# Patient Record
Sex: Female | Born: 1945
Health system: Southern US, Community
[De-identification: ages and names within clinical notes are randomized; demographics above are authoritative.]

## PROBLEM LIST (undated history)

## (undated) DIAGNOSIS — I779 Disorder of arteries and arterioles, unspecified: Secondary | ICD-10-CM

## (undated) DIAGNOSIS — K219 Gastro-esophageal reflux disease without esophagitis: Secondary | ICD-10-CM

## (undated) DIAGNOSIS — M1990 Unspecified osteoarthritis, unspecified site: Secondary | ICD-10-CM

## (undated) DIAGNOSIS — I739 Peripheral vascular disease, unspecified: Secondary | ICD-10-CM

## (undated) DIAGNOSIS — I10 Essential (primary) hypertension: Secondary | ICD-10-CM

## (undated) DIAGNOSIS — E785 Hyperlipidemia, unspecified: Secondary | ICD-10-CM

## (undated) DIAGNOSIS — E039 Hypothyroidism, unspecified: Secondary | ICD-10-CM

## (undated) DIAGNOSIS — R0600 Dyspnea, unspecified: Secondary | ICD-10-CM

## (undated) HISTORY — DX: Dyspnea, unspecified: R06.00

## (undated) HISTORY — DX: Hypothyroidism, unspecified: E03.9

## (undated) HISTORY — DX: Peripheral vascular disease, unspecified: I73.9

## (undated) HISTORY — DX: Essential (primary) hypertension: I10

## (undated) HISTORY — DX: Gastro-esophageal reflux disease without esophagitis: K21.9

## (undated) HISTORY — DX: Disorder of arteries and arterioles, unspecified: I77.9

## (undated) HISTORY — DX: Unspecified osteoarthritis, unspecified site: M19.90

## (undated) HISTORY — DX: Hyperlipidemia, unspecified: E78.5

---

## 2001-12-26 HISTORY — PX: LAPAROSCOPIC CHOLECYSTECTOMY: SUR755

## 2005-05-15 ENCOUNTER — Emergency Department (HOSPITAL_COMMUNITY): Admission: EM | Admit: 2005-05-15 | Discharge: 2005-05-15 | Payer: Self-pay | Admitting: Family Medicine

## 2008-01-16 ENCOUNTER — Ambulatory Visit: Payer: Self-pay | Admitting: Cardiovascular Disease

## 2008-01-22 ENCOUNTER — Ambulatory Visit: Payer: Self-pay

## 2008-06-17 ENCOUNTER — Ambulatory Visit: Payer: Self-pay | Admitting: Cardiovascular Disease

## 2008-06-17 LAB — CONVERTED CEMR LAB
AST: 51 units/L — ABNORMAL HIGH (ref 0–37)
Albumin: 3.8 g/dL (ref 3.5–5.2)
Alkaline Phosphatase: 68 units/L (ref 39–117)
Total Bilirubin: 0.9 mg/dL (ref 0.3–1.2)

## 2008-09-15 ENCOUNTER — Ambulatory Visit: Payer: Self-pay

## 2009-03-31 ENCOUNTER — Ambulatory Visit: Payer: Self-pay

## 2009-08-31 ENCOUNTER — Encounter: Payer: Self-pay | Admitting: Cardiovascular Disease

## 2009-11-30 ENCOUNTER — Telehealth (INDEPENDENT_AMBULATORY_CARE_PROVIDER_SITE_OTHER): Payer: Self-pay | Admitting: *Deleted

## 2009-12-28 ENCOUNTER — Telehealth: Payer: Self-pay | Admitting: Cardiovascular Disease

## 2010-04-03 ENCOUNTER — Encounter: Payer: Self-pay | Admitting: Cardiovascular Disease

## 2010-04-12 DIAGNOSIS — R0989 Other specified symptoms and signs involving the circulatory and respiratory systems: Secondary | ICD-10-CM

## 2010-04-12 DIAGNOSIS — M1990 Unspecified osteoarthritis, unspecified site: Secondary | ICD-10-CM | POA: Insufficient documentation

## 2010-04-12 DIAGNOSIS — E785 Hyperlipidemia, unspecified: Secondary | ICD-10-CM | POA: Insufficient documentation

## 2010-04-12 DIAGNOSIS — E039 Hypothyroidism, unspecified: Secondary | ICD-10-CM | POA: Insufficient documentation

## 2010-04-12 DIAGNOSIS — I10 Essential (primary) hypertension: Secondary | ICD-10-CM | POA: Insufficient documentation

## 2010-04-12 DIAGNOSIS — K219 Gastro-esophageal reflux disease without esophagitis: Secondary | ICD-10-CM

## 2010-04-12 DIAGNOSIS — R0609 Other forms of dyspnea: Secondary | ICD-10-CM

## 2010-04-13 ENCOUNTER — Ambulatory Visit: Payer: Self-pay | Admitting: Cardiovascular Disease

## 2010-04-13 DIAGNOSIS — I6529 Occlusion and stenosis of unspecified carotid artery: Secondary | ICD-10-CM | POA: Insufficient documentation

## 2010-04-27 ENCOUNTER — Encounter: Payer: Self-pay | Admitting: Cardiovascular Disease

## 2010-08-02 ENCOUNTER — Encounter: Payer: Self-pay | Admitting: Cardiovascular Disease

## 2010-09-03 ENCOUNTER — Encounter: Payer: Self-pay | Admitting: Cardiovascular Disease

## 2010-09-06 ENCOUNTER — Ambulatory Visit: Payer: Self-pay

## 2010-09-06 ENCOUNTER — Ambulatory Visit: Payer: Self-pay | Admitting: Cardiovascular Disease

## 2010-10-04 ENCOUNTER — Telehealth: Payer: Self-pay | Admitting: Cardiovascular Disease

## 2010-10-15 ENCOUNTER — Encounter: Payer: Self-pay | Admitting: Cardiovascular Disease

## 2010-10-21 ENCOUNTER — Telehealth: Payer: Self-pay | Admitting: Cardiovascular Disease

## 2011-01-25 NOTE — Assessment & Plan Note (Signed)
Summary: f34m   Visit Type:  Follow-up Primary Derald Lorge:  Eugenie Birks, MD  CC:  none.  History of Present Illness: This is a 65 year old woman presenting today for follow up evaluation. She has a history of asymptomatic, moderate right internal carotid artery stenosis, hypertension, and dyslipidemia. She is a former smoker.   the patient denies chest pain, dyspnea, edema, or palpitations. She has had no neurologic symptoms, and specifically denies numbness, tingling, weakness, aphasia, or amaurosis.  Current Medications (verified): 1)  Crestor 40 Mg Tabs (Rosuvastatin Calcium) .Marland Kitchen.. 1 By Mouth Daily 2)  Prilosec 20 Mg Cpdr (Omeprazole) .... Take One Tablet By Mouth Once Daily. 3)  Celebrex 200 Mg Caps (Celecoxib) .... As Needed 4)  Enalapril Maleate 20 Mg Tabs (Enalapril Maleate) .... Take One Tablet By Mouth Daily 5)  Synthroid 75 Mcg Tabs (Levothyroxine Sodium) .... Take One Tablet By Mouth Once Daily. 6)  Patanol 0.1 % Soln (Olopatadine Hcl) .... 2 Gtt Each Eye 7)  Aspirin 81 Mg Tbec (Aspirin) .... Take One Tablet By Mouth Daily . Not Taking  Allergies: 1)  ! Codeine  Past History:  Past medical history reviewed for relevance to current acute and chronic problems.  Past Medical History: Current Problems:  DYSPNEA ON EXERTION (ICD-786.09) ESSENTIAL HYPERTENSION (ICD-401.9) DYSLIPIDEMIA (ICD-272.4) GASTROESOPHAGEAL REFLUX DISEASE (ICD-530.81) HYPOTHYROIDISM (ICD-244.9) OSTEOARTHRITIS (ICD-715.90) Carotid Stenosis without infarction (433.10)  Vital Signs:  Patient profile:   65 year old female Height:      62 inches Weight:      189.75 pounds BMI:     34.83 Pulse rate:   94 / minute Pulse rhythm:   regular Resp:     18 per minute BP sitting:   140 / 90  (left arm) Cuff size:   large  Vitals Entered By: Vikki Ports (September 06, 2010 1:53 PM)  Physical Exam  General:  Pt is alert and oriented, obese woman, in no acute distress. HEENT: normal Neck: normal  carotid upstrokes with a soft right carotid bruit, JVP normal Lungs: CTA CV: RRR without murmur or gallop Abd: soft, NT, positive BS, no bruit, no organomegaly Ext: no clubbing, cyanosis, or edema. peripheral pulses 2+ and equal Skin: warm and dry without rash    EKG  Procedure date:  09/06/2010  Findings:      NSR 94 bpm, within normal limits.  Impression & Recommendations:  Problem # 1:  CAROTID ARTERY STENOSIS, WITHOUT INFARCTION (ICD-433.10) Preliminary carotid duplex scan reviewed from earlier today and shows 60-79% RICA stenosis with peak velocities 194/49 on the right and 40-59% stenosis of the LICA with peak velocities 113/38. Recommend continued risk reduction measures with ASA, statin, and antihypertensive Rx.  Her updated medication list for this problem includes:    Aspirin 81 Mg Tbec (Aspirin) .Marland Kitchen... Take one tablet by mouth daily . not taking  Orders: EKG w/ Interpretation (93000)  Problem # 2:  ESSENTIAL HYPERTENSION (ICD-401.9) BP above goal. Recommend add HCTZ 12.5 mg daily with followup BMET 2 weeks after initiation of Rx.  Her updated medication list for this problem includes:    Enalapril Maleate 20 Mg Tabs (Enalapril maleate) .Marland Kitchen... Take one tablet by mouth daily    Aspirin 81 Mg Tbec (Aspirin) .Marland Kitchen... Take one tablet by mouth daily . not taking    Hydrochlorothiazide 12.5 Mg Tabs (Hydrochlorothiazide) .Marland Kitchen... Take one tablet by mouth daily.  BP today: 140/90 Prior BP: 138/84 (04/13/2010)  Problem # 3:  DYSLIPIDEMIA (ICD-272.4)  Lipids follow by PCP and goal LDL  is less than 70 mg/dL.  Her updated medication list for this problem includes:    Crestor 40 Mg Tabs (Rosuvastatin calcium) .Marland Kitchen... 1 by mouth daily  Her updated medication list for this problem includes:    Crestor 40 Mg Tabs (Rosuvastatin calcium) .Marland Kitchen... 1 by mouth daily  Patient Instructions: 1)  Your physician recommends that you schedule a follow-up appointment in: 6 months 2)  Your physician  recommends that you have a BMP in 2 weeks. 433.10 3)  Your physician has recommended you make the following change in your medication: START taking HCTZ 12.5mg  by mouth daily. 4)  Your physician has requested that you have a carotid duplex in 6 months. This test is an ultrasound of the carotid arteries in your neck. It looks at blood flow through these arteries that supply the brain with blood. Allow one hour for this exam. There are no restrictions or special instructions. Prescriptions: HYDROCHLOROTHIAZIDE 12.5 MG TABS (HYDROCHLOROTHIAZIDE) Take one tablet by mouth daily.  #30 x 1   Entered by:   Whitney Maeola Sarah RN   Authorized by:   Norva Karvonen, MD   Signed by:   Ellender Hose RN on 09/06/2010   Method used:   Electronically to        [facility]. [address] [phone]* (retail)       [address]       [place], [place]  [address]       Ph: [phone] or [phone]       Fax: [phone]   RxID:   [id number] HYDROCHLOROTHIAZIDE 12.5 MG TABS (HYDROCHLOROTHIAZIDE) Take one tablet by mouth daily.  #90 x 3   Entered by:   Whitney Maeola Sarah RN   Authorized by:   Norva Karvonen, MD   Signed by:   Ellender Hose RN on 09/06/2010   Method used:   Print then Give to Patient   RxID:   [id number]

## 2011-01-25 NOTE — Assessment & Plan Note (Signed)
Summary: rov   Visit Type:  Follow-up Primary Provider:  Eugenie Birks, MD  CC:  none.  History of Present Illness: This is a 65 year old woman presenting today for follow up evaluation. She has a history of asymptomatic, moderate right internal carotid artery stenosis, hypertension, and dyslipidemia. She is a former smoker. Her last carotid duplex scan from April 01, 1999 and showed moderate plaque on the right and mild plaque on the left with 60-79% right ICA stenosis and less than 40% left ICA stenosis.  the patient is doing well at present. She denies chest pain, dyspnea, edema, lightheadedness, or palpitations. She has had no cerebrovascular symptoms.  Current Medications (verified): 1)  Crestor 40 Mg Tabs (Rosuvastatin Calcium) .Marland Kitchen.. 1 By Mouth Daily 2)  Prilosec 20 Mg Cpdr (Omeprazole) .... Take One Tablet By Mouth Once Daily. 3)  Celebrex 200 Mg Caps (Celecoxib) .... As Needed 4)  Enalapril Maleate 20 Mg Tabs (Enalapril Maleate) .... Take One Tablet By Mouth Daily 5)  Synthroid 75 Mcg Tabs (Levothyroxine Sodium) .... Take One Tablet By Mouth Once Daily. 6)  Patanol 0.1 % Soln (Olopatadine Hcl) .... 2 Gtt Each Eye 7)  Aspirin 81 Mg Tbec (Aspirin) .... Take One Tablet By Mouth Daily  Allergies (verified): 1)  ! Codeine  Past History:  Past medical history reviewed for relevance to current acute and chronic problems.  Past Medical History: Reviewed history from 04/12/2010 and no changes required. Current Problems:  DYSPNEA ON EXERTION (ICD-786.09) ESSENTIAL HYPERTENSION (ICD-401.9) DYSLIPIDEMIA (ICD-272.4) GASTROESOPHAGEAL REFLUX DISEASE (ICD-530.81) HYPOTHYROIDISM (ICD-244.9) OSTEOARTHRITIS (ICD-715.90)  Review of Systems       Negative except as per HPI   Vital Signs:  Patient profile:   65 year old female Height:      62 inches Weight:      184 pounds BMI:     33.78 Pulse rate:   82 / minute Resp:     16 per minute BP supine:   138 / 84  (left arm)  Vitals  Entered By: Laurance Flatten CMA (April 13, 2010 3:02 PM)  Physical Exam  General:  Pt is alert and oriented, in no acute distress. HEENT: normal Neck: normal carotid upstrokes without bruits, JVP normal Lungs: CTA CV: RRR without murmur or gallop Abd: soft, NT, positive BS, no bruit, no organomegaly Ext: no clubbing, cyanosis, or edema. peripheral pulses 2+ and equal Skin: warm and dry without rash    EKG  Procedure date:  04/13/2010  Findings:      NORMAL SINUS RHYTHM, within normal limits, heart rate 82 beats per minute  Impression & Recommendations:  Problem # 1:  CAROTID ARTERY STENOSIS, WITHOUT INFARCTION (ICD-433.10) The patient is doing well with aggressive risk reduction measures.  The patient is due for a followup carotid duplex scan. She does not live in [place] so we will arrange followup at the time of her next visit.  Her updated medication list for this problem includes:    Aspirin 81 Mg Tbec (Aspirin) .Marland Kitchen... Take one tablet by mouth daily  Problem # 2:  ESSENTIAL HYPERTENSION (ICD-401.9) Blood pressure control on enalapril.  Her updated medication list for this problem includes:    Enalapril Maleate 20 Mg Tabs (Enalapril maleate) .Marland Kitchen... Take one tablet by mouth daily    Aspirin 81 Mg Tbec (Aspirin) .Marland Kitchen... Take one tablet by mouth daily  Orders: EKG w/ Interpretation (93000)  BP today: 138/84  Problem # 3:  DYSLIPIDEMIA (ICD-272.4) LDL goal is less than 100 mg per deciliter  in the setting of her carotid stenosis. She had recent blood drawn by her primary care physician and will requested this bloodwork is faxed to our office so her lipids can be reviewed.  Her updated medication list for this problem includes:    Crestor 40 Mg Tabs (Rosuvastatin calcium) .Marland Kitchen... 1 by mouth daily  Patient Instructions: 1)  Your physician wants you to follow-up in: 1 YEAR.  You will receive a reminder letter in the mail two months in advance. If you don't receive a letter,  please call our office to schedule the follow-up appointment. 2)  Your physician has requested that you have a carotid duplex. This test is an ultrasound of the carotid arteries in your neck. It looks at blood flow through these arteries that supply the brain with blood. Allow one hour for this exam. There are no restrictions or special instructions. (Please call to schedule when you will be in town) 3)  Please have your primary care physician fax your lab results to our office (fax [phone]) Prescriptions: CRESTOR 40 MG TABS (ROSUVASTATIN CALCIUM) 1 by mouth daily  #90 x 0   Entered by:   Julieta Gutting, RN, BSN   Authorized by:   Norva Karvonen, MD   Signed by:   Julieta Gutting, RN, BSN on 04/13/2010   Method used:   Electronically to        [facility]* (mail-order)       [address] [place], [place]  [address]       Ph: [phone]       Fax: [phone]   RxID:   [id number]

## 2011-01-25 NOTE — Miscellaneous (Signed)
Summary: Orders Update  Clinical Lists Changes  Orders: Added new Test order of Carotid Duplex (Carotid Duplex) - Signed 

## 2011-01-25 NOTE — Progress Notes (Signed)
Summary: REFILL  Phone Note Refill Request Message from:  Patient on December 28, 2009 10:29 AM  Refills Requested: Medication #1:  CRESTOR 40MG  SENT [facility] [phone]  Initial call taken by: Judie Grieve,  December 28, 2009 10:30 AM  Follow-up for Phone Call        Left message for pt to call back. Marrion Coy, CNA  December 28, 2009 3:18 PM  Follow-up by: Marrion Coy, CNA,  December 28, 2009 3:18 PM  Additional Follow-up for Phone Call Additional follow up Details #1::        Pt states Dr. Excell Seltzer changed Rx to crestor. Pt just had labs drawn, she will have office fax results to Dr. Excell Seltzer. Will send in rx for crestor for 1 refill. Marrion Coy, CNA  December 28, 2009 3:49 PM  Additional Follow-up by: Marrion Coy, CNA,  December 28, 2009 3:49 PM    New/Updated Medications: CRESTOR 40 MG TABS (ROSUVASTATIN CALCIUM) 1 by mouth daily Prescriptions: CRESTOR 40 MG TABS (ROSUVASTATIN CALCIUM) 1 by mouth daily  #90 x 0   Entered by:   Marrion Coy, CNA   Authorized by:   Norva Karvonen, MD   Signed by:   Marrion Coy, CNA on 12/28/2009   Method used:   Faxed to ...       [facility] (mail[facility]order)       [address] [place], [place]  [address]       Ph: [phone]       Fax: [phone]   RxID:   [id number]

## 2011-01-25 NOTE — Progress Notes (Signed)
Summary: refill meds  Phone Note Refill Request Call back at Home Phone [phone] Message from:  Patient on October 21, 2010 12:42 PM  Refills Requested: Medication #1:  CRESTOR 40 MG TABS 1 by mouth daily [facility]    Method Requested: Fax to [facility] Initial call taken by: Lorne Skeens,  October 21, 2010 12:42 PM  Follow-up for Phone Call        Rx faxed to pharmacy. Vikki Ports  October 21, 2010 4:12 PM     Prescriptions: CRESTOR 40 MG TABS (ROSUVASTATIN CALCIUM) 1 by mouth daily  #90 x 0   Entered by:   Vikki Ports   Authorized by:   Norva Karvonen, MD   Signed by:   Vikki Ports on 10/21/2010   Method used:   Faxed to ...       [facility] (mail[facility]order)       [address] [place], [place]  [address]       Ph: [phone]       Fax: [phone]   RxID:   [id number]

## 2011-01-25 NOTE — Progress Notes (Signed)
Summary: pt calling question re bloodwork  Phone Note Call from Patient   Caller: Patient [phone] Reason for Call: Talk to Nurse Summary of Call: pt calling re blood work Initial call taken by: Glynda Jaeger,  October 04, 2010 12:07 PM  Follow-up for Phone Call        Pt calling to reconfirm bmp draw at her pcp.  She will have that drawn this week and results will be faxed ot our office. Mylo Red RN

## 2011-01-27 ENCOUNTER — Telehealth: Payer: Self-pay | Admitting: Cardiovascular Disease

## 2011-02-16 ENCOUNTER — Encounter: Payer: Self-pay | Admitting: Cardiology

## 2011-02-16 NOTE — Progress Notes (Signed)
Summary: pt needs Rx called in and she has had a adress change  Phone Note Refill Request Call back at [phone] Message from:  Patient  Refills Requested: Medication #1:  CRESTOR 40 MG TABS 1 by mouth daily  Medication #2:  HYDROCHLOROTHIAZIDE 12.5 MG TABS Take one tablet by mouth daily.. [facility] pls tell them to forward to [address] [place], [place] [address]  Initial call taken by: Omer Jack,  January 27, 2011 1:16 PM  Follow-up for Phone Call        pt needs to call [facility] and change her own demographics before refill can be processed. attempted calling pt at [phone] and [phone]. Celestia Khat, [place]  January 27, 2011 3:44 PM . Unable to reach patient by phone. Telephone # is not in service any more. Vikki Ports  February 10, 2011 10:33 AM

## 2011-03-08 NOTE — Medication Information (Signed)
Summary: [facility]: Crestor & Hydrochlorothiazide Prescription  [facility]: Crestor & Hydrochlorothiazide Prescription   Imported By: Earl Many 03/03/2011 18:59:36  _____________________________________________________________________  External Attachment:    Type:   Image     Comment:   External Document

## 2011-04-26 ENCOUNTER — Encounter: Payer: Self-pay | Admitting: Cardiovascular Disease

## 2011-04-27 ENCOUNTER — Encounter: Payer: Self-pay | Admitting: Cardiovascular Disease

## 2011-04-27 ENCOUNTER — Encounter (INDEPENDENT_AMBULATORY_CARE_PROVIDER_SITE_OTHER): Payer: Medicare Other | Admitting: Cardiology

## 2011-04-27 ENCOUNTER — Ambulatory Visit (INDEPENDENT_AMBULATORY_CARE_PROVIDER_SITE_OTHER): Payer: Medicare Other | Admitting: Cardiovascular Disease

## 2011-04-27 VITALS — BP 120/58 | HR 80 | Resp 18 | Ht 62.0 in | Wt 201.0 lb

## 2011-04-27 DIAGNOSIS — I6529 Occlusion and stenosis of unspecified carotid artery: Secondary | ICD-10-CM

## 2011-04-27 DIAGNOSIS — E785 Hyperlipidemia, unspecified: Secondary | ICD-10-CM

## 2011-04-27 DIAGNOSIS — I10 Essential (primary) hypertension: Secondary | ICD-10-CM

## 2011-04-27 NOTE — Assessment & Plan Note (Signed)
The patient remains asymptomatic. She is due for a followup duplex scan which will be done today. She is on appropriate medical therapy with aspirin, a statin drug, and an ACE inhibitor.

## 2011-04-27 NOTE — Progress Notes (Signed)
HPI:  This is a 65 year old woman presenting for followup evaluation. She is followed for asymptomatic right carotid stenosis. Her most recent carotid duplex scan was in September 2011 it demonstrated moderate plaque on the right with 60-79% right internal carotid artery stenosis. The left side had mild plaque with velocities in the 40-59% range.  The patient denies symptoms of numbness, tingling, or weakness of her extremities. She denies amaurosis or expressive aphasia. She denies chest pain, dyspnea, orthopnea, edema, or PND.   She has seen her primary care physician and was noted to have increased blood glucose. She is giving her 4 months and then she will return for followup and consider going on oral hypoglycemic medications at that time.  Outpatient Encounter Prescriptions as of 04/27/2011  Medication Sig Dispense Refill  . aspirin 81 MG tablet Take 81 mg by mouth daily.        . celecoxib (CELEBREX) 200 MG capsule Take 200 mg by mouth as needed.        . enalapril (VASOTEC) 20 MG tablet Take 20 mg by mouth daily.        . hydrochlorothiazide (,MICROZIDE/HYDRODIURIL,) 12.5 MG capsule Take 12.5 mg by mouth daily.        Marland Kitchen levothyroxine (SYNTHROID, LEVOTHROID) 75 MCG tablet Take 75 mcg by mouth daily.        Marland Kitchen olopatadine (PATANOL) 0.1 % ophthalmic solution Place 1 drop into both eyes 2 (two) times daily.        Marland Kitchen omeprazole (PRILOSEC) 20 MG capsule Take 20 mg by mouth daily.        . rosuvastatin (CRESTOR) 40 MG tablet Take 40 mg by mouth daily.          Allergies  Allergen Reactions  . Codeine     REACTION: nausea    Past Medical History  Diagnosis Date  . Dyspnea   . HTN (hypertension)   . Dyslipidemia   . GERD (gastroesophageal reflux disease)   . Hypothyroidism   . Osteoarthritis   . Carotid stenosis     w/out infarction    ROS: Negative except as per HPI  BP 120/58  Pulse 80  Resp 18  Ht 5\' 2"  (1.575 m)  Wt 201 lb (91.173 kg)  BMI 36.76 kg/m2  PHYSICAL EXAM: Pt  is alert and oriented, obese woman in NAD HEENT: normal Neck: JVP - normal, carotids 2+= with a soft right carotid bruit Lungs: CTA bilaterally CV: RRR without murmur or gallop Abd: soft, NT, Positive BS, no hepatomegaly Ext: no C/C/E, distal pulses intact and equal Skin: warm/dry no rash  EKG: Normal sinus rhythm 81 beats per minute, within normal limits  ASSESSMENT AND PLAN:

## 2011-04-27 NOTE — Assessment & Plan Note (Signed)
The patient is followed by her primary care physician. Her goal LDL is less than 100 in the setting of known carotid disease. We had a lengthy discussion regarding the importance of diet, exercise, and weight loss with respect to prevention of developing type 2 diabetes in reducing vascular events.

## 2011-04-27 NOTE — Patient Instructions (Signed)
Your physician has requested that you have a carotid duplex. This test is an ultrasound of the carotid arteries in your neck. It looks at blood flow through these arteries that supply the brain with blood. Allow one hour for this exam. There are no restrictions or special instructions.  Your physician wants you to follow-up in: 1 YEAR.  You will receive a reminder letter in the mail two months in advance. If you don't receive a letter, please call our office to schedule the follow-up appointment.  Your physician recommends that you continue on your current medications as directed. Please refer to the Current Medication list given to you today.

## 2011-04-27 NOTE — Assessment & Plan Note (Signed)
Well-controlled on current medications 

## 2011-05-03 ENCOUNTER — Encounter: Payer: Self-pay | Admitting: Cardiovascular Disease

## 2011-05-10 NOTE — Assessment & Plan Note (Signed)
[facility]                            CARDIOLOGY OFFICE NOTE   FRANCISCO, EYERLY                       MRN:          [id number]  DATE:01/16/2008                            DOB:          Jun 12, 1946    CHIEF COMPLAINT:  Exertional dyspnea.   HISTORY OF PRESENT ILLNESS:  Ms. Housh is a delightful 65 year old  woman who is self referred for evaluation of exertional dyspnea and  cardiac risk.  Her husband has recently undergone coronary bypass  surgery, and this has prompted the patient to inquire about her own  cardiac risk and have an evaluation for exertional dyspnea.  Ms. Mickel  has gained a good deal of weight since discontinuing tobacco back in  October 2007.  She associates this with progressive exertional dyspnea.  She feels dyspneic with climbing a single flight of stairs.  She has no  resting symptoms.  She has no symptoms with walking on level ground.  She denies orthopnea, PND or edema.  She has no chest pain or pressure.  She has no past history of cardiac or pulmonary disease.   Ms. Runkel is followed regularly by her primary care physician in [place], [place].  She is treated for hypertension and dyslipidemia.  Her blood work is done regularly, and she appears to have had close  clinical followup.  She had no other complaints at today's visit.   MEDICATIONS:  Include Lipitor daily, Prilosec daily, Celebrex daily,  enalapril daily and Synthroid daily.  The patient does not know the  doses of medications and did not bring them with her today.   ALLERGIES:  None.   DRUG INTOLERANCE:  CODEINE causes nausea.   PAST MEDICAL HISTORY:  1. Essential hypertension.  2. Dyslipidemia.  3. Osteoarthritis.  4. Hypothyroidism.  5. Gastroesophageal reflux disease.  6. Laparoscopic cholecystectomy in 2003.   SOCIAL HISTORY:  The patient is retired.  She was a cook in the public  school system.  She has been disabled from lower back  injury.  She is  married with three children.  Two of her children live locally in  [place].  She was a long-time smoker until October 2007 and has not  smoked since that time.  She does not drink alcohol.  She does not  exercise regularly.   FAMILY HISTORY:  Her mother died at age 88 of uncertain cause.  Father  died at age 57 of congestive heart failure.  She has one sister and  three brothers.  One brother had coronary bypass at age 92 and is still  living at age 9.   REVIEW OF SYSTEMS:  A complete 12-point Review of Systems was performed.  Pertinent positives include 30-pound weight gain since discontinuation  of smoking, irritable bowel syndrome with occasional diarrhea,  osteoarthritis with knee pain and back pain, gastroesophageal reflux,  elevated fasting blood glucose by her report with blood sugars from 105-  110. Other symptoms reviewed and negative except as detailed.   PHYSICAL EXAMINATION:  GENERAL:  The patient is very pleasant.  She is  an alert and  oriented woman in no acute distress.  VITAL SIGNS:  Weight is 205. Blood pressure 154/88 in the right arm  ,152/90 in the left arm, heart rate 84, respiratory rate 16.  HEENT:  Normal.  NECK:  Normal carotid upstrokes with a right carotid bruit.  No bruit on  the left. Jugular venous pressure is normal.  No thyromegaly or thyroid  nodules.  LUNGS:  Clear to auscultation bilaterally.  HEART:  The apex is not palpable. The heart is regular rate and rhythm  without murmurs or gallops.  ABDOMEN:  Soft, obese, nontender. No organomegaly.  No bruits.  EXTREMITIES:  No clubbing, cyanosis or edema.  Femoral pulses are 2+ and  equal. Posterior tibial and dorsalis pedis pulses are 2+ and equal.  BACK:  There is no CVA tenderness.  SKIN:  Warm and dry without rash.  NEUROLOGIC:  Cranial nerves II-XII are intact.  Strength 5/5 and equal  in the arms and legs.  LYMPHATICS:  No adenopathy.   EKG demonstrates normal sinus rhythm  that is within normal limits.   ASSESSMENT:  This Gorden is a 65 year old woman with exertional  dyspnea.  She has multiple cardiac risk factors including hypertension,  dyslipidemia, history of tobacco use, and family history of coronary  artery disease.  I have asked her to undergo an adenosine Myoview stress  study to rule out significant ischemic heart disease with her dyspnea as  a potential anginal equivalent.  This will help with risk stratification  in the setting of her multiple risk factors.  We had a long discussion  today regarding the importance of lifestyle modification and weight  loss.  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  We have  requested a copy of her lab work to review her lipids.  Also have  ordered a carotid ultrasound for further evaluation of her right carotid  bruit.  Further plans pending results of her adenosine Myoview stress  study as well as her carotid ultrasound.  If both of these tests are  negative, will plan on followup on an as-needed basis.     Veverly Fells. Excell Seltzer, MD  Electronically Signed    MDC/MedQ  DD: 01/16/2008  DT: 01/16/2008  Job #: [id number]   cc:   Eugenie Birks, MD, [place], [place]

## 2011-10-07 ENCOUNTER — Other Ambulatory Visit: Payer: Self-pay

## 2011-10-07 DIAGNOSIS — I6529 Occlusion and stenosis of unspecified carotid artery: Secondary | ICD-10-CM

## 2011-11-04 ENCOUNTER — Ambulatory Visit (INDEPENDENT_AMBULATORY_CARE_PROVIDER_SITE_OTHER): Payer: Medicare Other | Admitting: Cardiovascular Disease

## 2011-11-04 ENCOUNTER — Encounter: Payer: Self-pay | Admitting: Cardiovascular Disease

## 2011-11-04 ENCOUNTER — Encounter (INDEPENDENT_AMBULATORY_CARE_PROVIDER_SITE_OTHER): Payer: Medicare Other | Admitting: *Deleted

## 2011-11-04 ENCOUNTER — Ambulatory Visit: Payer: PRIVATE HEALTH INSURANCE | Admitting: Cardiovascular Disease

## 2011-11-04 DIAGNOSIS — I10 Essential (primary) hypertension: Secondary | ICD-10-CM

## 2011-11-04 DIAGNOSIS — I6529 Occlusion and stenosis of unspecified carotid artery: Secondary | ICD-10-CM

## 2011-11-04 MED ORDER — ROSUVASTATIN CALCIUM 40 MG PO TABS: 40.0000 mg | ORAL_TABLET | Freq: Every day | ORAL | Status: DC

## 2011-11-04 NOTE — Assessment & Plan Note (Signed)
The patient's last carotid duplex was reviewed. She has 60-80% stenosis in the right ICA and 40-60% stenosis in the left ICA. She is on an appropriate medical regimen with aspirin for antiplatelet therapy, lipid lowering, and antihypertensives to control her blood pressure. She has no neurologic symptoms. She will have a followup carotid duplex this afternoon and will repeat based on the findings of that study. She has been educated about signs and symptoms of stroke.

## 2011-11-04 NOTE — Patient Instructions (Signed)
Your physician wants you to follow-up in: 6 months  You will receive a reminder letter in the mail two months in advance. If you don't receive a letter, please call our office to schedule the follow-up appointment.  Your physician recommends that you continue on your current medications as directed. Please refer to the Current Medication list given to you today.  

## 2011-11-04 NOTE — Assessment & Plan Note (Signed)
Blood pressure is well controlled on Vasotec and hydrochlorothiazide.

## 2011-11-04 NOTE — Progress Notes (Signed)
HPI:  This is a 65 year old woman presenting for followup evaluation. The patient has asymptomatic carotid stenosis, hyperlipidemia, and hypertension. She is doing well since I have last seen her. She specifically denies symptoms of amaurosis fugax, numbness, tingling, or weakness of her extremities, or speech abnormalities. She denies chest pain or pressure. She notes that she's gained some more weight and has not been physically active. She is going to [place] soon and anticipates initiating an exercise program there.  Outpatient Encounter Prescriptions as of 11/04/2011  Medication Sig Dispense Refill  . aspirin 81 MG tablet Take 81 mg by mouth daily.        . celecoxib (CELEBREX) 200 MG capsule Take 200 mg by mouth as needed.        . enalapril (VASOTEC) 20 MG tablet Take 20 mg by mouth daily.        . hydrochlorothiazide (,MICROZIDE/HYDRODIURIL,) 12.5 MG capsule Take 12.5 mg by mouth daily.        Marland Kitchen levothyroxine (SYNTHROID, LEVOTHROID) 75 MCG tablet Take 75 mcg by mouth daily.        Marland Kitchen olopatadine (PATANOL) 0.1 % ophthalmic solution Place 1 drop into both eyes 2 (two) times daily.        Marland Kitchen omeprazole (PRILOSEC) 20 MG capsule Take 20 mg by mouth daily.        . rosuvastatin (CRESTOR) 40 MG tablet Take 1 tablet (40 mg total) by mouth daily.  90 tablet  3  . DISCONTD: rosuvastatin (CRESTOR) 40 MG tablet Take 40 mg by mouth daily.          Allergies  Allergen Reactions  . Codeine     REACTION: nausea    Past Medical History  Diagnosis Date  . Dyspnea   . HTN (hypertension)   . Dyslipidemia   . GERD (gastroesophageal reflux disease)   . Hypothyroidism   . Osteoarthritis   . Carotid stenosis     w/out infarction    ROS: Negative except as per HPI  BP 112/66  Pulse 80  Ht 5\' 1"  (1.549 m)  Wt 91.627 kg (202 lb)  BMI 38.17 kg/m2  PHYSICAL EXAM: Pt is alert and oriented, obese woman in NAD HEENT: normal Neck: JVP - normal, carotids 2+= with soft bilateral bruits Lungs: CTA  bilaterally CV: RRR without murmur or gallop Abd: soft, NT, Positive BS, no hepatomegaly Ext: no C/C/E, distal pulses intact and equal Skin: warm/dry no rash  ASSESSMENT AND PLAN:

## 2012-04-16 ENCOUNTER — Other Ambulatory Visit: Payer: Self-pay

## 2012-04-16 DIAGNOSIS — I6529 Occlusion and stenosis of unspecified carotid artery: Secondary | ICD-10-CM

## 2012-06-05 ENCOUNTER — Ambulatory Visit: Payer: PRIVATE HEALTH INSURANCE | Admitting: Cardiovascular Disease

## 2012-06-19 ENCOUNTER — Ambulatory Visit: Payer: PRIVATE HEALTH INSURANCE | Admitting: Cardiovascular Disease

## 2012-07-05 ENCOUNTER — Encounter (INDEPENDENT_AMBULATORY_CARE_PROVIDER_SITE_OTHER): Payer: Medicare Other

## 2012-07-05 DIAGNOSIS — I6529 Occlusion and stenosis of unspecified carotid artery: Secondary | ICD-10-CM

## 2012-07-06 ENCOUNTER — Encounter: Payer: Self-pay | Admitting: Cardiovascular Disease

## 2012-07-06 ENCOUNTER — Ambulatory Visit (INDEPENDENT_AMBULATORY_CARE_PROVIDER_SITE_OTHER): Payer: Medicare Other | Admitting: Cardiovascular Disease

## 2012-07-06 VITALS — BP 124/70 | HR 81 | Ht 61.0 in | Wt 208.0 lb

## 2012-07-06 DIAGNOSIS — I779 Disorder of arteries and arterioles, unspecified: Secondary | ICD-10-CM

## 2012-07-06 DIAGNOSIS — I10 Essential (primary) hypertension: Secondary | ICD-10-CM

## 2012-07-06 DIAGNOSIS — E785 Hyperlipidemia, unspecified: Secondary | ICD-10-CM

## 2012-07-06 NOTE — Patient Instructions (Signed)
Your physician wants you to follow-up in: 12 months with Dr Theodoro Parma will receive a reminder letter in the mail two months in advance. If you don't receive a letter, please call our office to schedule the follow-up appointment.--- call the office when coming back into town and we will schedule appointment  Your physician has requested that you have a carotid duplex. This test is an ultrasound of the carotid arteries in your neck. It looks at blood flow through these arteries that supply the brain with blood. Allow one hour for this exam. There are no restrictions or special instructions.  Prior to office visit in one year

## 2012-07-06 NOTE — Progress Notes (Signed)
   HPI:  66 year old woman presenting for followup evaluation. She is followed for asymptomatic carotid stenosis, hyperlipidemia, and hypertension. She had a recent duplex scan demonstrating 60-79% stenosis on the right and 40-59% stenosis on the left. This is stable from her previous studies.  Overall the patient is doing well. She her back yesterday, but she's had episodic back problems in the past. She has no other complaints at present. She specifically denies chest pain or pressure, dyspnea, or edema. She's had no palpitations, lightheadedness, or syncope. She denies symptoms of amaurosis, speech disturbance, or extremity weakness.  Outpatient Encounter Prescriptions as of 07/06/2012  Medication Sig Dispense Refill  . aspirin 81 MG tablet Take 81 mg by mouth daily.        . celecoxib (CELEBREX) 200 MG capsule Take 200 mg by mouth as needed.        . enalapril (VASOTEC) 20 MG tablet Take 20 mg by mouth 2 (two) times daily.       Marland Kitchen levothyroxine (SYNTHROID, LEVOTHROID) 75 MCG tablet Take 75 mcg by mouth daily.        Marland Kitchen olopatadine (PATANOL) 0.1 % ophthalmic solution Place 1 drop into both eyes 2 (two) times daily.        Marland Kitchen omeprazole (PRILOSEC) 20 MG capsule Take 20 mg by mouth daily.        . rosuvastatin (CRESTOR) 40 MG tablet Take 20 mg by mouth daily.      Marland Kitchen DISCONTD: rosuvastatin (CRESTOR) 40 MG tablet Take 1 tablet (40 mg total) by mouth daily.  90 tablet  3  . DISCONTD: hydrochlorothiazide (,MICROZIDE/HYDRODIURIL,) 12.5 MG capsule Take 12.5 mg by mouth daily.          Allergies  Allergen Reactions  . Codeine     REACTION: nausea    Past Medical History  Diagnosis Date  . Dyspnea   . HTN (hypertension)   . Dyslipidemia   . GERD (gastroesophageal reflux disease)   . Hypothyroidism   . Osteoarthritis   . Carotid stenosis     w/out infarction    ROS: Negative except as per HPI  BP 124/70  Pulse 81  Ht 5\' 1"  (1.549 m)  Wt 94.348 kg (208 lb)  BMI 39.30 kg/m2  SpO2  95%  PHYSICAL EXAM: Pt is alert and oriented, NAD HEENT: normal Neck: JVP - normal, carotids 2+= without bruits Lungs: CTA bilaterally CV: RRR without murmur or gallop Abd: soft, NT, Positive BS, no hepatomegaly Ext: no C/C/E, distal pulses intact and equal Skin: warm/dry no rash  EKG:  Normal sinus rhythm 81 beats per minute, within normal limits.  ASSESSMENT AND PLAN: 1. Asymptomatic carotid stenosis. Her recent duplex scan was reviewed and it is stable. She should have a followup carotid duplex in 6-12 months. She will continue on her current medical program without changes.  2. Essential hypertension. Blood pressure is well controlled on Vasotec.  3. Dyslipidemia. The patient is on a statin drug and she is followed by her primary care physician.  Tonny Bollman 07/06/2012 11:05 AM

## 2012-09-13 ENCOUNTER — Telehealth: Payer: Self-pay | Admitting: Cardiovascular Disease

## 2012-09-13 NOTE — Telephone Encounter (Signed)
Pt's insurance has increased price for crestor, needs fax letter that pt needs to be on crestor and they can reduce the price 1-800- [phone], pl call pt if can be done [phone]

## 2012-09-14 ENCOUNTER — Encounter: Payer: Self-pay | Admitting: Cardiovascular Disease

## 2012-09-14 NOTE — Telephone Encounter (Signed)
Letter dictated. We can print and fax when we her back in the office on Monday.

## 2012-09-17 NOTE — Telephone Encounter (Signed)
Letter faxed to insurance company per the pt's request. Pt aware.

## 2012-10-09 ENCOUNTER — Encounter: Payer: Self-pay | Admitting: Cardiovascular Disease

## 2012-10-09 NOTE — Telephone Encounter (Signed)
Pt calling re crestor Traci Stout, said lauren was to do it last week and felt she did, but [facility] is saying that haven't heard , so pt would like them called back today

## 2012-10-09 NOTE — Telephone Encounter (Signed)
This encounter was created in error - please disregard.

## 2012-11-15 ENCOUNTER — Other Ambulatory Visit: Payer: Self-pay | Admitting: Cardiovascular Disease

## 2012-11-15 MED ORDER — ROSUVASTATIN CALCIUM 40 MG PO TABS: 20.0000 mg | ORAL_TABLET | Freq: Every day | ORAL | Status: DC

## 2013-07-08 ENCOUNTER — Encounter (INDEPENDENT_AMBULATORY_CARE_PROVIDER_SITE_OTHER): Payer: Medicare Other

## 2013-07-08 DIAGNOSIS — I6529 Occlusion and stenosis of unspecified carotid artery: Secondary | ICD-10-CM

## 2013-07-29 ENCOUNTER — Encounter: Payer: Self-pay | Admitting: Cardiovascular Disease

## 2013-07-29 NOTE — Telephone Encounter (Signed)
Follow up  ° ° ° ° °Pt is returning your call  °

## 2013-07-30 NOTE — Telephone Encounter (Signed)
This encounter was created in error - please disregard.

## 2013-09-30 ENCOUNTER — Ambulatory Visit: Payer: Medicare Other | Admitting: Cardiovascular Disease

## 2013-10-10 ENCOUNTER — Encounter: Payer: Self-pay | Admitting: Physician Assistant

## 2013-10-10 ENCOUNTER — Ambulatory Visit (INDEPENDENT_AMBULATORY_CARE_PROVIDER_SITE_OTHER): Payer: Medicare Other | Admitting: Physician Assistant

## 2013-10-10 DIAGNOSIS — I10 Essential (primary) hypertension: Secondary | ICD-10-CM

## 2013-10-10 DIAGNOSIS — E785 Hyperlipidemia, unspecified: Secondary | ICD-10-CM

## 2013-10-10 DIAGNOSIS — I6529 Occlusion and stenosis of unspecified carotid artery: Secondary | ICD-10-CM

## 2013-10-10 MED ORDER — ROSUVASTATIN CALCIUM 40 MG PO TABS: 20.0000 mg | ORAL_TABLET | Freq: Every day | ORAL | Status: AC

## 2013-10-10 NOTE — Patient Instructions (Signed)
Your physician wants you to follow-up in: 29 MONTH. You will receive a reminder letter in the mail two months in advance. If you don't receive a letter, please call our office to schedule the follow-up appointment.  A REFILL FOR YOUR CRESTOR WAS SENT INTO [facility]

## 2013-10-10 NOTE — Progress Notes (Signed)
[address] [place], [place]  [address] Phone: [phone] Fax:  [phone]  Date:  10/10/2013   ID:  Traci Stout, DOB [birth year], MRN [id number]  PCP:  No primary provider on file.  Cardiologist:  Dr. Tonny Bollman     History of Present Illness: Traci Stout is a 67 y.o. female who returns for follow up.  Her husband is with her today to followup from a recent hospitalization.  Their daughter works in our cardiac catheterization lab. They usually live in [place]. They're currently staying with her daughter while her husband recovers. She is followed with a history of asymptomatic bilateral carotid stenosis, HL, HTN. Last carotid US (7/14): Right 60-79%, left 40-59%. Followup recommended in one year. Patient denies any symptoms of TIAs or amaurosis fugax. She denies chest pain or shortness of breath. She denies syncope, orthopnea, PND or edema. Blood pressures at home have been fairly stable. She typically has elevated blood pressures in a doctor's office.  Wt Readings from Last 3 Encounters:  10/10/13 200 lb 1.9 oz (90.774 kg)  07/06/12 208 lb (94.348 kg)  11/04/11 202 lb (91.627 kg)     Past Medical History  Diagnosis Date  . Dyspnea   . HTN (hypertension)   . Dyslipidemia   . GERD (gastroesophageal reflux disease)   . Hypothyroidism   . Osteoarthritis   . Carotid stenosis     w/out infarction    Current Outpatient Prescriptions  Medication Sig Dispense Refill  . aspirin 81 MG tablet Take 81 mg by mouth daily.        . celecoxib (CELEBREX) 200 MG capsule Take 200 mg by mouth as needed.        . enalapril (VASOTEC) 20 MG tablet Take 20 mg by mouth 2 (two) times daily.       Marland Kitchen levothyroxine (SYNTHROID, LEVOTHROID) 75 MCG tablet Take 75 mcg by mouth daily.        Marland Kitchen METFORMIN HCL PO Take by mouth 2 (two) times daily.      Marland Kitchen olopatadine (PATANOL) 0.1 % ophthalmic solution Place 1 drop into both eyes 2 (two) times daily.        Marland Kitchen omeprazole  (PRILOSEC) 20 MG capsule Take 20 mg by mouth daily.        . rosuvastatin (CRESTOR) 40 MG tablet Take 0.5 tablets (20 mg total) by mouth daily.  90 tablet  3   No current facility-administered medications for this visit.    Allergies:    Allergies  Allergen Reactions  . Codeine     REACTION: nausea    Social History:  The patient  reports that she quit smoking about 7 years ago. She does not have any smokeless tobacco history on file. She reports that she does not drink alcohol.   Family History:  The patient's family history includes Coronary artery disease in an other family member; Heart failure in an other family member.   ROS:  Please see the history of present illness.      All other systems reviewed and negative.   PHYSICAL EXAM: VS:  BP 144/88  Pulse 77  Ht 5\' 1"  (1.549 m)  Wt 200 lb 1.9 oz (90.774 kg)  BMI 37.83 kg/m2 Well nourished, well developed, in no acute distress HEENT: normal Neck: no JVD Cardiac:  normal S1, S2; RRR; no murmur Lungs:  clear to auscultation bilaterally, no wheezing, rhonchi or rales Abd: soft, nontender, no hepatomegaly Ext: no edema Skin: warm  and dry Neuro:  CNs 2-12 intact, no focal abnormalities noted  EKG:  NSR, HR 77, normal axis     ASSESSMENT AND PLAN:  1. Carotid Stenosis: Asymptomatic. Recent carotid Dopplers with moderate bilateral plaque. She will have followup Dopplers in 1 year. Continue aspirin and statin. 2. Hypertension: Fair control. Blood pressures at home have been stable. She will continue to monitor this and let us know if her blood pressures continue to increase. 3. Hyperlipidemia: Continue statin. Labs are followed by her primary care physician. 4. Disposition: Followup with Dr. Excell Seltzer in one year.  Signed, Tereso Newcomer, PA-C  10/10/2013 5:42 PM

## 2016-09-20 ENCOUNTER — Encounter: Payer: Self-pay | Admitting: *Deleted

## 2016-09-20 ENCOUNTER — Encounter (INDEPENDENT_AMBULATORY_CARE_PROVIDER_SITE_OTHER): Payer: Self-pay

## 2016-09-20 ENCOUNTER — Encounter: Payer: Self-pay | Admitting: Physician Assistant

## 2016-09-20 ENCOUNTER — Ambulatory Visit (INDEPENDENT_AMBULATORY_CARE_PROVIDER_SITE_OTHER): Payer: PRIVATE HEALTH INSURANCE | Admitting: Physician Assistant

## 2016-09-20 VITALS — BP 122/58 | HR 88 | Ht 61.0 in | Wt 206.0 lb

## 2016-09-20 DIAGNOSIS — E785 Hyperlipidemia, unspecified: Secondary | ICD-10-CM | POA: Diagnosis not present

## 2016-09-20 DIAGNOSIS — I10 Essential (primary) hypertension: Secondary | ICD-10-CM | POA: Diagnosis not present

## 2016-09-20 DIAGNOSIS — I779 Disorder of arteries and arterioles, unspecified: Secondary | ICD-10-CM | POA: Diagnosis not present

## 2016-09-20 DIAGNOSIS — I208 Other forms of angina pectoris: Secondary | ICD-10-CM | POA: Diagnosis not present

## 2016-09-20 DIAGNOSIS — I739 Peripheral vascular disease, unspecified: Secondary | ICD-10-CM

## 2016-09-20 DIAGNOSIS — R0609 Other forms of dyspnea: Secondary | ICD-10-CM

## 2016-09-20 LAB — CBC WITH DIFFERENTIAL/PLATELET
BASOS PCT: 0 %
Basophils Absolute: 0 cells/uL (ref 0–200)
EOS PCT: 1 %
Eosinophils Absolute: 109 cells/uL (ref 15–500)
HEMATOCRIT: 39.6 % (ref 35.0–45.0)
Hemoglobin: 12.8 g/dL (ref 11.7–15.5)
LYMPHS ABS: 2180 {cells}/uL (ref 850–3900)
LYMPHS PCT: 20 %
MCH: 25.8 pg — ABNORMAL LOW (ref 27.0–33.0)
MCHC: 32.3 g/dL (ref 32.0–36.0)
MCV: 79.7 fL — ABNORMAL LOW (ref 80.0–100.0)
MONO ABS: 763 {cells}/uL (ref 200–950)
MPV: 10.8 fL (ref 7.5–12.5)
Monocytes Relative: 7 %
NEUTROS PCT: 72 %
Neutro Abs: 7848 cells/uL (ref 1500–7800)
Platelets: 189 10*3/uL (ref 140–400)
RBC: 4.97 MIL/uL (ref 3.80–5.10)
RDW: 15.9 % — AB (ref 11.0–15.0)
WBC: 10.9 10*3/uL — AB (ref 3.8–10.8)

## 2016-09-20 LAB — BASIC METABOLIC PANEL
BUN: 27 mg/dL — ABNORMAL HIGH (ref 7–25)
CO2: 16 mmol/L — ABNORMAL LOW (ref 20–31)
Calcium: 9.5 mg/dL (ref 8.6–10.4)
Chloride: 104 mmol/L (ref 98–110)
Creat: 0.97 mg/dL — ABNORMAL HIGH (ref 0.60–0.93)
Glucose, Bld: 129 mg/dL — ABNORMAL HIGH (ref 65–99)
POTASSIUM: 4 mmol/L (ref 3.5–5.3)
SODIUM: 140 mmol/L (ref 135–146)

## 2016-09-20 MED FILL — NITROGLYCERIN 0.4 MG TAB SL: 0.4 | 7 days supply | Qty: 25 | Fill #0

## 2016-09-20 NOTE — Patient Instructions (Addendum)
Medication Instructions:  Your physician recommends that you continue on your current medications as directed. Please refer to the Current Medication list given to you today.  Labwork: TODAY BMET, CBC /DIFF, PT/INR  Testing/Procedures: 1. Your physician has requested that you have a cardiac catheterization. Cardiac catheterization is used to diagnose and/or treat various heart conditions. Doctors may recommend this procedure for a number of different reasons. The most common reason is to evaluate chest pain. Chest pain can be a symptom of coronary artery disease (CAD), and cardiac catheterization can show whether plaque is narrowing or blocking your heart's arteries. This procedure is also used to evaluate the valves, as well as measure the blood flow and oxygen levels in different parts of your heart. For further information please visit [email or web address] Please follow instruction sheet, as given.  2. Your physician has requested that you have a carotid duplex. This test is an ultrasound of the carotid arteries in your neck. It looks at blood flow through these arteries that supply the brain with blood. Allow one hour for this exam. There are no restrictions or special instructions. THIS WILL NEED TO BE DONE WITHIN THE NEXT WEEK; PT LIVES IN [place]   Follow-Up:  Any Other Special Instructions Will Be Listed Below (If Applicable).  If you need a refill on your cardiac medications before your next appointment, please call your pharmacy.  ADDENDUM: 09/21/16 PER SCOTT WEAVER, PAC TO HAVE A CHEST X-RAY THIS MORNING BEFORE YOUR CATH. ORDER PLACED TO BE DONE AT [address] [facility].

## 2016-09-20 NOTE — Progress Notes (Signed)
Cardiology Office Note:    Date:  09/20/2016   ID:  Traci Stout, DOB August 22, 1946, MRN [id number]  PCP:  No primary care provider on file.  Cardiologist:  Dr. Tonny Bollman   Electrophysiologist:  n/a  Referring MD: No ref. provider found   Chief Complaint  Patient presents with  . Hospitalization Follow-up    L arm pain; admx to hospital in [place].    History of Present Illness:    Traci Stout is a 70 y.o. female with a hx of carotid artery disease, HTN, HL.  Her husband, Traci Stout, was a patient of Dr. Earmon Phoenix.  He passed away approximately 1 year ago.  She still lives in [place].  Her daughter, Traci Stout, works in our cardiac cath lab.  Traci Stout was last seen here in 10/14.  She was recently admitted to a hospital in [facility] for arm symptoms and dizziness.  ECG and Troponin levels were normal.  The patient's daughter brought her back to be with her in [place] and spoke with Dr. Excell Seltzer today.  She is added on for evaluation.    Labs from [facility] in [place], [place] on 09/19/16: Hgb 12.9, SCr 0.84, K 3.8, Tn < 0.04 x 2; CXR no acute disease.   She has noted significant fatigue this past week. She also notes dyspnea on exertion which is unusual for her.  She has occ chest pain described as pressure.  She notes intermittent L arm ache.  Her chest pain and L arm pain are not related to exertion.  She denies assoc nausea or diaphoresis.  She denies orthopnea, PND, edema.  She denies syncope.    Prior CV studies that were reviewed today include:    Carotid US 7/14 RICA 60-79%; LICA 40-59% FU 1 year  Past Medical History:  Diagnosis Date  . Carotid stenosis    w/out infarction  . Dyslipidemia   . Dyspnea   . GERD (gastroesophageal reflux disease)   . HTN (hypertension)   . Hypothyroidism   . Osteoarthritis     Past Surgical History:  Procedure Laterality Date  . LAPAROSCOPIC CHOLECYSTECTOMY  2003    Current Medications: Outpatient Medications Prior to  Visit  Medication Sig Dispense Refill  . aspirin 81 MG tablet Take 81 mg by mouth daily.      . celecoxib (CELEBREX) 200 MG capsule Take 200 mg by mouth daily as needed for moderate pain.     Marland Kitchen olopatadine (PATANOL) 0.1 % ophthalmic solution Place 2 drops into both eyes 2 (two) times daily.     Marland Kitchen omeprazole (PRILOSEC) 20 MG capsule Take 20 mg by mouth daily.      . rosuvastatin (CRESTOR) 40 MG tablet Take 0.5 tablets (20 mg total) by mouth daily. 90 tablet 3  . enalapril (VASOTEC) 20 MG tablet Take 20 mg by mouth 2 (two) times daily.     Marland Kitchen levothyroxine (SYNTHROID, LEVOTHROID) 75 MCG tablet 75 mcg.     Marland Kitchen METFORMIN HCL PO Take by mouth 2 (two) times daily.     No facility-administered medications prior to visit.       Allergies:   Codeine   Social History   Social History  . Marital status: Married    Spouse name: N/A  . Number of children: 3  . Years of education: N/A   Occupational History  . disabled / retired    Social History Main Topics  . Smoking status: Former Smoker    Quit date:  09/25/2006  . Smokeless tobacco: Never Used  . Alcohol use No  . Drug use: Unknown  . Sexual activity: Not Asked   Other Topics Concern  . None   Social History Narrative  . None     Family History:  The patient's family history includes Heart failure in her father and mother.   ROS:   Please see the history of present illness.    ROS All other systems reviewed and are negative.   EKGs/Labs/Other Test Reviewed:    EKG:  EKG is  ordered today.  The ekg ordered today demonstrates NSR, HR 88, normal axis, NSSTTW changes, QTc 467 ms, no acute changes.  Recent Labs: No results found for requested labs within last 8760 hours.   Recent Lipid Panel No results found for: CHOL, TRIG, HDL, CHOLHDL, VLDL, LDLCALC, LDLDIRECT   Physical Exam:    VS:  BP (!) 122/58   Pulse 88   Ht 5\' 1"  (1.549 m)   Wt 206 lb (93.4 kg)   SpO2 98%   BMI 38.92 kg/m     Wt Readings from Last 3  Encounters:  09/20/16 206 lb (93.4 kg)  10/10/13 200 lb 1.9 oz (90.8 kg)  07/06/12 208 lb (94.3 kg)     Physical Exam  Constitutional: She is oriented to person, place, and time. She appears well-developed and well-nourished. No distress.  HENT:  Head: Normocephalic and atraumatic.  Eyes: No scleral icterus.  Neck: No JVD present.  Cardiovascular: Normal rate and regular rhythm.   Murmur heard.  Low-pitched early systolic murmur is present with a grade of 1/6  at the upper right sternal border Pulses:      Dorsalis pedis pulses are 2+ on the right side, and 2+ on the left side.  Pulmonary/Chest: Effort normal. She has no wheezes. She has no rales.  Abdominal: Soft. There is no tenderness.  Musculoskeletal: She exhibits no edema.  Neurological: She is alert and oriented to person, place, and time.  Skin: Skin is warm and dry.  Psychiatric: She has a normal mood and affect.    ASSESSMENT:    1. Exertional angina (HCC)   2. Bilateral carotid artery disease (HCC)   3. HLD (hyperlipidemia)   4. Benign essential HTN    PLAN:    In order of problems listed above:  1. Chest pain - She has symptoms of chest discomfort and dyspnea on exertion that I think are c/w CCS class 3 angina.  She has multiple risk factors for CAD including known vascular disease, diabetes, HTN, prior smoking and HL.  Her ECG is unchanged.  We discussed the possibility of proceeding with stress testing vs LHC.  I favor LHC given her symptoms and risk factors.  I d/w Dr. Tonny BollmanMichael Cooper as well who agreed.  Risks and benefits of cardiac catheterization have been discussed with the patient.  These include bleeding, infection, kidney damage, stroke, heart attack, death.  The patient understands these risks and is willing to proceed.    -  Arrange LHC with Dr. Tonny BollmanMichael Cooper tomorrow  -  Labs today: BMET, CBC, INR  -  PRN nitroglycerin given  2. Carotid artery disease - No FU since 2014.  Schedule FU Carotid [place].   [place] [facility], will ask to have this done ASAP so it can be done before she goes home.   3. HL - Continues statin.    4. HTN - BP controlled.    Medication Adjustments/Labs  and Tests Ordered: Current medicines are reviewed at length with the patient today.  Concerns regarding medicines are outlined above.  Medication changes, Labs and Tests ordered today are outlined in the Patient Instructions noted below. Patient Instructions  Medication Instructions:  Your physician recommends that you continue on your current medications as directed. Please refer to the Current Medication list given to you today.  Labwork: TODAY BMET, CBC /DIFF, PT/INR  Testing/Procedures: 1. Your physician has requested that you have a cardiac catheterization. Cardiac catheterization is used to diagnose and/or treat various heart conditions. Doctors may recommend this procedure for a number of different reasons. The most common reason is to evaluate chest pain. Chest pain can be a symptom of coronary artery disease (CAD), and cardiac catheterization can show whether plaque is narrowing or blocking your heart's arteries. This procedure is also used to evaluate the valves, as well as measure the blood flow and oxygen levels in different parts of your heart. For further information please visit [email or web address] Please follow instruction sheet, as given.  2. Your physician has requested that you have a carotid duplex. This test is an ultrasound of the carotid arteries in your neck. It looks at blood flow through these arteries that supply the brain with blood. Allow one hour for this exam. There are no restrictions or special instructions. THIS WILL NEED TO BE DONE WITHIN THE NEXT WEEK; PT LIVES IN [place]   Follow-Up:  Any Other Special Instructions Will Be Listed Below (If Applicable).  If you need a refill on your cardiac medications before your next appointment, please call your  pharmacy.  Signed, Tereso Newcomer, PA-C  09/20/2016 4:58 PM    [facility] [address] [place], [place], [place]  [address] Phone: [phone]; Fax: [phone]

## 2016-09-21 ENCOUNTER — Ambulatory Visit (HOSPITAL_COMMUNITY)
Admission: RE | Admit: 2016-09-21 | Discharge: 2016-09-21 | Disposition: A | Discharge status: Home / Self Care | Payer: Medicare Other | Source: Ambulatory Visit | Attending: Cardiovascular Disease | Admitting: Cardiovascular Disease

## 2016-09-21 ENCOUNTER — Telehealth: Payer: Self-pay | Admitting: *Deleted

## 2016-09-21 ENCOUNTER — Ambulatory Visit
Admission: RE | Admit: 2016-09-21 | Discharge: 2016-09-21 | Disposition: A | Discharge status: Home / Self Care | Payer: PRIVATE HEALTH INSURANCE | Source: Ambulatory Visit | Attending: Physician Assistant | Admitting: Physician Assistant

## 2016-09-21 ENCOUNTER — Encounter (HOSPITAL_COMMUNITY)
Admission: RE | Disposition: A | Discharge status: Home / Self Care | Payer: Self-pay | Source: Ambulatory Visit | Attending: Cardiovascular Disease

## 2016-09-21 DIAGNOSIS — I208 Other forms of angina pectoris: Secondary | ICD-10-CM

## 2016-09-21 DIAGNOSIS — E039 Hypothyroidism, unspecified: Secondary | ICD-10-CM | POA: Diagnosis not present

## 2016-09-21 DIAGNOSIS — K219 Gastro-esophageal reflux disease without esophagitis: Secondary | ICD-10-CM | POA: Diagnosis not present

## 2016-09-21 DIAGNOSIS — I200 Unstable angina: Secondary | ICD-10-CM | POA: Diagnosis present

## 2016-09-21 DIAGNOSIS — I10 Essential (primary) hypertension: Secondary | ICD-10-CM | POA: Insufficient documentation

## 2016-09-21 DIAGNOSIS — I25110 Atherosclerotic heart disease of native coronary artery with unstable angina pectoris: Secondary | ICD-10-CM | POA: Insufficient documentation

## 2016-09-21 DIAGNOSIS — Z8249 Family history of ischemic heart disease and other diseases of the circulatory system: Secondary | ICD-10-CM | POA: Diagnosis not present

## 2016-09-21 DIAGNOSIS — E785 Hyperlipidemia, unspecified: Secondary | ICD-10-CM | POA: Insufficient documentation

## 2016-09-21 DIAGNOSIS — I6523 Occlusion and stenosis of bilateral carotid arteries: Secondary | ICD-10-CM | POA: Diagnosis not present

## 2016-09-21 DIAGNOSIS — M1990 Unspecified osteoarthritis, unspecified site: Secondary | ICD-10-CM | POA: Diagnosis not present

## 2016-09-21 DIAGNOSIS — Z87891 Personal history of nicotine dependence: Secondary | ICD-10-CM | POA: Insufficient documentation

## 2016-09-21 DIAGNOSIS — R0609 Other forms of dyspnea: Principal | ICD-10-CM

## 2016-09-21 DIAGNOSIS — Z7982 Long term (current) use of aspirin: Secondary | ICD-10-CM | POA: Diagnosis not present

## 2016-09-21 HISTORY — PX: CARDIAC CATHETERIZATION: SHX172

## 2016-09-21 LAB — GLUCOSE, CAPILLARY
GLUCOSE-CAPILLARY: 94 mg/dL (ref 65–99)
Glucose-Capillary: 137 mg/dL — ABNORMAL HIGH (ref 65–99)

## 2016-09-21 LAB — PROTIME-INR
INR: 1
PROTHROMBIN TIME: 10.8 s (ref 9.0–11.5)

## 2016-09-21 SURGERY — LEFT HEART CATH AND CORONARY ANGIOGRAPHY

## 2016-09-21 MED ORDER — ONDANSETRON HCL 4 MG/2ML IJ SOLN: 4.0000 mg | Freq: Four times a day (QID) | INTRAMUSCULAR | Status: DC | PRN

## 2016-09-21 MED ORDER — VERAPAMIL HCL 2.5 MG/ML IV SOLN
INTRAVENOUS | Status: AC
  Filled 2016-09-21: qty 2

## 2016-09-21 MED ORDER — MIDAZOLAM HCL 2 MG/2ML IJ SOLN
INTRAMUSCULAR | Status: DC | PRN
Start: 2016-09-21 — End: 2016-09-21
  Administered 2016-09-21: 2 mg via INTRAVENOUS
  Administered 2016-09-21: 1 mg via INTRAVENOUS

## 2016-09-21 MED ORDER — LIDOCAINE HCL (PF) 1 % IJ SOLN
INTRAMUSCULAR | Status: DC | PRN
  Administered 2016-09-21: 2 mL via SUBCUTANEOUS

## 2016-09-21 MED ORDER — SODIUM CHLORIDE 0.9% FLUSH: 3.0000 mL | Freq: Two times a day (BID) | INTRAVENOUS | Status: DC

## 2016-09-21 MED ORDER — HEPARIN (PORCINE) IN NACL 2-0.9 UNIT/ML-% IJ SOLN
INTRAMUSCULAR | Status: AC
  Filled 2016-09-21: qty 1000

## 2016-09-21 MED ORDER — SODIUM CHLORIDE 0.9 % IV SOLN: 250.0000 mL | INTRAVENOUS | Status: DC | PRN

## 2016-09-21 MED ORDER — HEPARIN SODIUM (PORCINE) 1000 UNIT/ML IJ SOLN
INTRAMUSCULAR | Status: DC | PRN
  Administered 2016-09-21: 5000 [IU] via INTRAVENOUS

## 2016-09-21 MED ORDER — SODIUM CHLORIDE 0.9 % WEIGHT BASED INFUSION
3.0000 mL/kg/h | INTRAVENOUS | Status: DC
  Administered 2016-09-21: 3 mL/kg/h via INTRAVENOUS

## 2016-09-21 MED ORDER — IOPAMIDOL (ISOVUE-370) INJECTION 76%
INTRAVENOUS | Status: AC
  Filled 2016-09-21: qty 100

## 2016-09-21 MED ORDER — HEPARIN (PORCINE) IN NACL 2-0.9 UNIT/ML-% IJ SOLN
INTRAMUSCULAR | Status: DC | PRN
  Administered 2016-09-21: 1000 mL

## 2016-09-21 MED ORDER — SODIUM CHLORIDE 0.9% FLUSH: 3.0000 mL | INTRAVENOUS | Status: DC | PRN

## 2016-09-21 MED ORDER — MIDAZOLAM HCL 2 MG/2ML IJ SOLN
INTRAMUSCULAR | Status: AC
  Filled 2016-09-21: qty 2

## 2016-09-21 MED ORDER — FENTANYL CITRATE (PF) 100 MCG/2ML IJ SOLN
INTRAMUSCULAR | Status: DC | PRN
  Administered 2016-09-21 (×2): 25 ug via INTRAVENOUS

## 2016-09-21 MED ORDER — ACETAMINOPHEN 325 MG PO TABS: 650.0000 mg | ORAL_TABLET | ORAL | Status: DC | PRN

## 2016-09-21 MED ORDER — HEPARIN SODIUM (PORCINE) 1000 UNIT/ML IJ SOLN
INTRAMUSCULAR | Status: AC
  Filled 2016-09-21: qty 1

## 2016-09-21 MED ORDER — FENTANYL CITRATE (PF) 100 MCG/2ML IJ SOLN
INTRAMUSCULAR | Status: AC
  Filled 2016-09-21: qty 2

## 2016-09-21 MED ORDER — SODIUM CHLORIDE 0.9 % IV SOLN: INTRAVENOUS | Status: DC

## 2016-09-21 MED ORDER — ASPIRIN 81 MG PO CHEW: 81.0000 mg | CHEWABLE_TABLET | ORAL | Status: DC

## 2016-09-21 MED ORDER — VERAPAMIL HCL 2.5 MG/ML IV SOLN
INTRAVENOUS | Status: DC | PRN
  Administered 2016-09-21: 17:00:00 via INTRA_ARTERIAL

## 2016-09-21 MED ORDER — IOPAMIDOL (ISOVUE-370) INJECTION 76%
INTRAVENOUS | Status: DC | PRN
  Administered 2016-09-21: 70 mL via INTRAVENOUS

## 2016-09-21 MED ORDER — LIDOCAINE HCL (PF) 1 % IJ SOLN
INTRAMUSCULAR | Status: AC
  Filled 2016-09-21: qty 30

## 2016-09-21 MED ORDER — SODIUM CHLORIDE 0.9 % WEIGHT BASED INFUSION: 1.0000 mL/kg/h | INTRAVENOUS | Status: DC

## 2016-09-21 SURGICAL SUPPLY — 12 items
CATH IMPULSE 5F ANG/FL3.5 (CATHETERS) ×3 IMPLANT
COVER PRB 48X5XTLSCP FOLD TPE (BAG) ×1 IMPLANT
COVER PROBE 5X48 (BAG) ×3
DEVICE RAD COMP TR BAND LRG (VASCULAR PRODUCTS) ×3 IMPLANT
GLIDESHEATH SLEND SS 6F .021 (SHEATH) ×3 IMPLANT
KIT HEART LEFT (KITS) ×3 IMPLANT
PACK CARDIAC CATHETERIZATION (CUSTOM PROCEDURE TRAY) ×3 IMPLANT
SYR MEDRAD MARK V 150ML (SYRINGE) ×3 IMPLANT
TRANSDUCER W/STOPCOCK (MISCELLANEOUS) ×3 IMPLANT
TUBING CIL FLEX 10 FLL-RA (TUBING) ×3 IMPLANT
WIRE HI TORQ VERSACORE-J 145CM (WIRE) ×3 IMPLANT
WIRE SAFE-T 1.5MM-J .035X260CM (WIRE) ×3 IMPLANT

## 2016-09-21 NOTE — Telephone Encounter (Signed)
Pt seen in the office yesterday with Traci NeighborsScott W. PA. DPR was filled out however, it has not yet been scanned yet. Daughter Amy was given permission ok to s/w. Amy is notified of lab and cxr results for the pt by phone with verbal understanding.

## 2016-09-21 NOTE — H&P (View-Only) (Signed)
Cardiology Office Note:    Date:  09/20/2016   ID:  Traci Stout, DOB August 22, 1946, MRN [id number]  PCP:  No primary care provider on file.  Cardiologist:  Dr. Tonny Bollman   Electrophysiologist:  n/a  Referring MD: No ref. provider found   Chief Complaint  Patient presents with  . Hospitalization Follow-up    L arm pain; admx to hospital in [place].    History of Present Illness:    Traci Stout is a 70 y.o. female with a hx of carotid artery disease, HTN, HL.  Her husband, Traci Stout, was a patient of Dr. Earmon Phoenix.  He passed away approximately 1 year ago.  She still lives in [place].  Her daughter, Traci Stout, works in our cardiac cath lab.  [name] was last seen here in 10/14.  She was recently admitted to a hospital in [facility] for arm symptoms and dizziness.  ECG and Troponin levels were normal.  The patient's daughter brought her back to be with her in [place] and spoke with Dr. Excell Seltzer today.  She is added on for evaluation.    Labs from [facility] in [place], [place] on 09/19/16: Hgb 12.9, SCr 0.84, K 3.8, Tn < 0.04 x 2; CXR no acute disease.   She has noted significant fatigue this past week. She also notes dyspnea on exertion which is unusual for her.  She has occ chest pain described as pressure.  She notes intermittent L arm ache.  Her chest pain and L arm pain are not related to exertion.  She denies assoc nausea or diaphoresis.  She denies orthopnea, PND, edema.  She denies syncope.    Prior CV studies that were reviewed today include:    Carotid US 7/14 RICA 60-79%; LICA 40-59% FU 1 year  Past Medical History:  Diagnosis Date  . Carotid stenosis    w/out infarction  . Dyslipidemia   . Dyspnea   . GERD (gastroesophageal reflux disease)   . HTN (hypertension)   . Hypothyroidism   . Osteoarthritis     Past Surgical History:  Procedure Laterality Date  . LAPAROSCOPIC CHOLECYSTECTOMY  2003    Current Medications: Outpatient Medications Prior to  Visit  Medication Sig Dispense Refill  . aspirin 81 MG tablet Take 81 mg by mouth daily.      . celecoxib (CELEBREX) 200 MG capsule Take 200 mg by mouth daily as needed for moderate pain.     Marland Kitchen olopatadine (PATANOL) 0.1 % ophthalmic solution Place 2 drops into both eyes 2 (two) times daily.     Marland Kitchen omeprazole (PRILOSEC) 20 MG capsule Take 20 mg by mouth daily.      . rosuvastatin (CRESTOR) 40 MG tablet Take 0.5 tablets (20 mg total) by mouth daily. 90 tablet 3  . enalapril (VASOTEC) 20 MG tablet Take 20 mg by mouth 2 (two) times daily.     Marland Kitchen levothyroxine (SYNTHROID, LEVOTHROID) 75 MCG tablet 75 mcg.     Marland Kitchen METFORMIN HCL PO Take by mouth 2 (two) times daily.     No facility-administered medications prior to visit.       Allergies:   Codeine   Social History   Social History  . Marital status: Married    Spouse name: N/A  . Number of children: 3  . Years of education: N/A   Occupational History  . disabled / retired    Social History Main Topics  . Smoking status: Former Smoker    Quit date:  09/25/2006  . Smokeless tobacco: Never Used  . Alcohol use No  . Drug use: Unknown  . Sexual activity: Not Asked   Other Topics Concern  . None   Social History Narrative  . None     Family History:  The patient's family history includes Heart failure in her father and mother.   ROS:   Please see the history of present illness.    ROS All other systems reviewed and are negative.   EKGs/Labs/Other Test Reviewed:    EKG:  EKG is  ordered today.  The ekg ordered today demonstrates NSR, HR 88, normal axis, NSSTTW changes, QTc 467 ms, no acute changes.  Recent Labs: No results found for requested labs within last 8760 hours.   Recent Lipid Panel No results found for: CHOL, TRIG, HDL, CHOLHDL, VLDL, LDLCALC, LDLDIRECT   Physical Exam:    VS:  BP (!) 122/58   Pulse 88   Ht 5\' 1"  (1.549 m)   Wt 206 lb (93.4 kg)   SpO2 98%   BMI 38.92 kg/m     Wt Readings from Last 3  Encounters:  09/20/16 206 lb (93.4 kg)  10/10/13 200 lb 1.9 oz (90.8 kg)  07/06/12 208 lb (94.3 kg)     Physical Exam  Constitutional: She is oriented to person, place, and time. She appears well-developed and well-nourished. No distress.  HENT:  Head: Normocephalic and atraumatic.  Eyes: No scleral icterus.  Neck: No JVD present.  Cardiovascular: Normal rate and regular rhythm.   Murmur heard.  Low-pitched early systolic murmur is present with a grade of 1/6  at the upper right sternal border Pulses:      Dorsalis pedis pulses are 2+ on the right side, and 2+ on the left side.  Pulmonary/Chest: Effort normal. She has no wheezes. She has no rales.  Abdominal: Soft. There is no tenderness.  Musculoskeletal: She exhibits no edema.  Neurological: She is alert and oriented to person, place, and time.  Skin: Skin is warm and dry.  Psychiatric: She has a normal mood and affect.    ASSESSMENT:    1. Exertional angina (HCC)   2. Bilateral carotid artery disease (HCC)   3. HLD (hyperlipidemia)   4. Benign essential HTN    PLAN:    In order of problems listed above:  1. Chest pain - She has symptoms of chest discomfort and dyspnea on exertion that I think are c/w CCS class 3 angina.  She has multiple risk factors for CAD including known vascular disease, diabetes, HTN, prior smoking and HL.  Her ECG is unchanged.  We discussed the possibility of proceeding with stress testing vs LHC.  I favor LHC given her symptoms and risk factors.  I d/w Dr. Tonny BollmanMichael Cooper as well who agreed.  Risks and benefits of cardiac catheterization have been discussed with the patient.  These include bleeding, infection, kidney damage, stroke, heart attack, death.  The patient understands these risks and is willing to proceed.    -  Arrange LHC with Dr. Tonny BollmanMichael Cooper tomorrow  -  Labs today: BMET, CBC, INR  -  PRN nitroglycerin given  2. Carotid artery disease - No FU since 2014.  Schedule FU Carotid [place].   [place] [facility], will ask to have this done ASAP so it can be done before she goes home.   3. HL - Continues statin.    4. HTN - BP controlled.    Medication Adjustments/Labs  and Tests Ordered: Current medicines are reviewed at length with the patient today.  Concerns regarding medicines are outlined above.  Medication changes, Labs and Tests ordered today are outlined in the Patient Instructions noted below. Patient Instructions  Medication Instructions:  Your physician recommends that you continue on your current medications as directed. Please refer to the Current Medication list given to you today.  Labwork: TODAY BMET, CBC /DIFF, PT/INR  Testing/Procedures: 1. Your physician has requested that you have a cardiac catheterization. Cardiac catheterization is used to diagnose and/or treat various heart conditions. Doctors may recommend this procedure for a number of different reasons. The most common reason is to evaluate chest pain. Chest pain can be a symptom of coronary artery disease (CAD), and cardiac catheterization can show whether plaque is narrowing or blocking your heart's arteries. This procedure is also used to evaluate the valves, as well as measure the blood flow and oxygen levels in different parts of your heart. For further information please visit [email or web address] Please follow instruction sheet, as given.  2. Your physician has requested that you have a carotid duplex. This test is an ultrasound of the carotid arteries in your neck. It looks at blood flow through these arteries that supply the brain with blood. Allow one hour for this exam. There are no restrictions or special instructions. THIS WILL NEED TO BE DONE WITHIN THE NEXT WEEK; PT LIVES IN [place]   Follow-Up:  Any Other Special Instructions Will Be Listed Below (If Applicable).  If you need a refill on your cardiac medications before your next appointment, please call your  pharmacy.  Signed, Tereso Newcomer, PA-C  09/20/2016 4:58 PM    [facility] [address] [place], [place], [place]  [address] Phone: [phone]; Fax: [phone]

## 2016-09-21 NOTE — Interval H&P Note (Signed)
Cath Lab Visit (complete for each Cath Lab visit)  Clinical Evaluation Leading to the Procedure:   ACS: Yes.    Non-ACS:    Anginal Classification: CCS IV  Anti-ischemic medical therapy: Minimal Therapy (1 class of medications)  Non-Invasive Test Results: No non-invasive testing performed  Prior CABG: No previous CABG      History and Physical Interval Note:  09/21/2016 4:25 PM  Traci Stout  has presented today for surgery, with the diagnosis of angina  The various methods of treatment have been discussed with the patient and family. After consideration of risks, benefits and other options for treatment, the patient has consented to  Procedure(s): Left Heart Cath and Coronary Angiography (N/A) as a surgical intervention .  The patient's history has been reviewed, patient examined, no change in status, stable for surgery.  I have reviewed the patient's chart and labs.  Questions were answered to the patient's satisfaction.     Tonny Bollmanooper, [place]

## 2016-09-21 NOTE — Discharge Instructions (Signed)
Radial Site Care °Refer to this sheet in the next few weeks. These instructions provide you with information about caring for yourself after your procedure. Your health care provider may also give you more specific instructions. Your treatment has been planned according to current medical practices, but problems sometimes occur. Call your health care provider if you have any problems or questions after your procedure. °WHAT TO EXPECT AFTER THE PROCEDURE °After your procedure, it is typical to have the following: °· Bruising at the radial site that usually fades within 1-2 weeks. °· Blood collecting in the tissue (hematoma) that may be painful to the touch. It should usually decrease in size and tenderness within 1-2 weeks. °HOME CARE INSTRUCTIONS °· Take medicines only as directed by your health care provider. °· You may shower 24-48 hours after the procedure or as directed by your health care provider. Remove the bandage (dressing) and gently wash the site with plain soap and water. Pat the area dry with a clean towel. Do not rub the site, because this may cause bleeding. °· Do not take baths, swim, or use a hot tub until your health care provider approves. °· Check your insertion site every day for redness, swelling, or drainage. °· Do not apply powder or lotion to the site. °· Do not flex or bend the affected arm for 24 hours or as directed by your health care provider. °· Do not push or pull heavy objects with the affected arm for 24 hours or as directed by your health care provider. °· Do not lift over 10 lb (4.5 kg) for 5 days after your procedure or as directed by your health care provider. °· Ask your health care provider when it is okay to: °¨ Return to work or school. °¨ Resume usual physical activities or sports. °¨ Resume sexual activity. °· Do not drive home if you are discharged the same day as the procedure. Have someone else drive you. °· You may drive 24 hours after the procedure unless otherwise  instructed by your health care provider. °· Do not operate machinery or power tools for 24 hours after the procedure. °· If your procedure was done as an outpatient procedure, which means that you went home the same day as your procedure, a responsible adult should be with you for the first 24 hours after you arrive home. °· Keep all follow-up visits as directed by your health care provider. This is important. °SEEK MEDICAL CARE IF: °· You have a fever. °· You have chills. °· You have increased bleeding from the radial site. Hold pressure on the site. CALL 911 °SEEK IMMEDIATE MEDICAL CARE IF: °· You have unusual pain at the radial site. °· You have redness, warmth, or swelling at the radial site. °· You have drainage (other than a small amount of blood on the dressing) from the radial site. °· The radial site is bleeding, and the bleeding does not stop after 30 minutes of holding steady pressure on the site. °· Your arm or hand becomes pale, cool, tingly, or numb. °  °This information is not intended to replace advice given to you by your health care provider. Make sure you discuss any questions you have with your health care provider. °  °Document Released: 01/14/2011 Document Revised: 01/02/2015 Document Reviewed: 06/30/2014 °Elsevier Interactive Patient Education ©2016 [employer] ° °

## 2016-09-21 NOTE — Research (Addendum)
CADLAD Informed Consent   Subject Name: Traci Stout  Subject met inclusion and exclusion criteria.  The informed consent form, study requirements and expectations were reviewed with the subject and questions and concerns were addressed prior to the signing of the consent form.  The subject verbalized understanding of the trail requirements.  The subject agreed to participate in the CADLAD trial and signed the informed consent.  The informed consent was obtained prior to performance of any protocol-specific procedures for the subject.  A copy of the signed informed consent was given to the subject and a copy was placed in the subject's medical record.   [name] 09/21/2016, 1405   Consent was obtained by Reuben Likes, RN Research Nurse Reuben Likes, RN Research Nurse

## 2016-09-21 NOTE — Addendum Note (Signed)
Addended by: Tarri FullerFIATO, CAROL M on: 09/21/2016 09:45 AM   Modules accepted: Orders

## 2016-09-21 NOTE — Telephone Encounter (Signed)
Pt notified today per Bing NeighborsScott W. PA that he would like for her to have a CXR today sometime before her cath. Pt verbalized understanding. Order placed to be done at [id number] [facility].

## 2016-09-22 ENCOUNTER — Encounter (HOSPITAL_COMMUNITY): Payer: Self-pay | Admitting: Cardiovascular Disease

## 2016-09-23 ENCOUNTER — Ambulatory Visit (HOSPITAL_COMMUNITY)
Admission: RE | Admit: 2016-09-23 | Discharge: 2016-09-23 | Disposition: A | Discharge status: Home / Self Care | Payer: Medicare Other | Source: Ambulatory Visit | Attending: Cardiology | Admitting: Cardiology

## 2016-09-23 DIAGNOSIS — I10 Essential (primary) hypertension: Secondary | ICD-10-CM | POA: Diagnosis not present

## 2016-09-23 DIAGNOSIS — I739 Peripheral vascular disease, unspecified: Secondary | ICD-10-CM

## 2016-09-23 DIAGNOSIS — I6523 Occlusion and stenosis of bilateral carotid arteries: Secondary | ICD-10-CM

## 2016-09-23 DIAGNOSIS — Z87891 Personal history of nicotine dependence: Secondary | ICD-10-CM | POA: Insufficient documentation

## 2016-09-23 DIAGNOSIS — E785 Hyperlipidemia, unspecified: Secondary | ICD-10-CM | POA: Insufficient documentation

## 2016-09-23 DIAGNOSIS — I6529 Occlusion and stenosis of unspecified carotid artery: Secondary | ICD-10-CM | POA: Diagnosis present

## 2016-09-23 DIAGNOSIS — I779 Disorder of arteries and arterioles, unspecified: Secondary | ICD-10-CM

## 2016-09-25 ENCOUNTER — Encounter: Payer: Self-pay | Admitting: Physician Assistant

## 2016-09-26 ENCOUNTER — Telehealth: Payer: Self-pay | Admitting: *Deleted

## 2016-09-26 MED FILL — ONE TOUCH ULTRA TEST STRIPS: 75 days supply | Qty: 300 | Fill #0

## 2016-09-26 NOTE — Telephone Encounter (Signed)
Lmtcb to go over carotid results.  

## 2016-09-30 NOTE — Telephone Encounter (Signed)
Pt notified of carotid us results and findings by phone with verbal understanding today.

## 2017-05-05 IMAGING — CR DG CHEST 2V
2 series · 2 of 2 positions shown · non-contrast
Comparison: None.

CLINICAL DATA: Per procedure for heart catheterization today, CHANIE
with left arm pain x 1 week, HTN, DM, there are no other chest
complaints (stat fax)

EXAM:
CHEST  2 VIEW

[w chest pa]
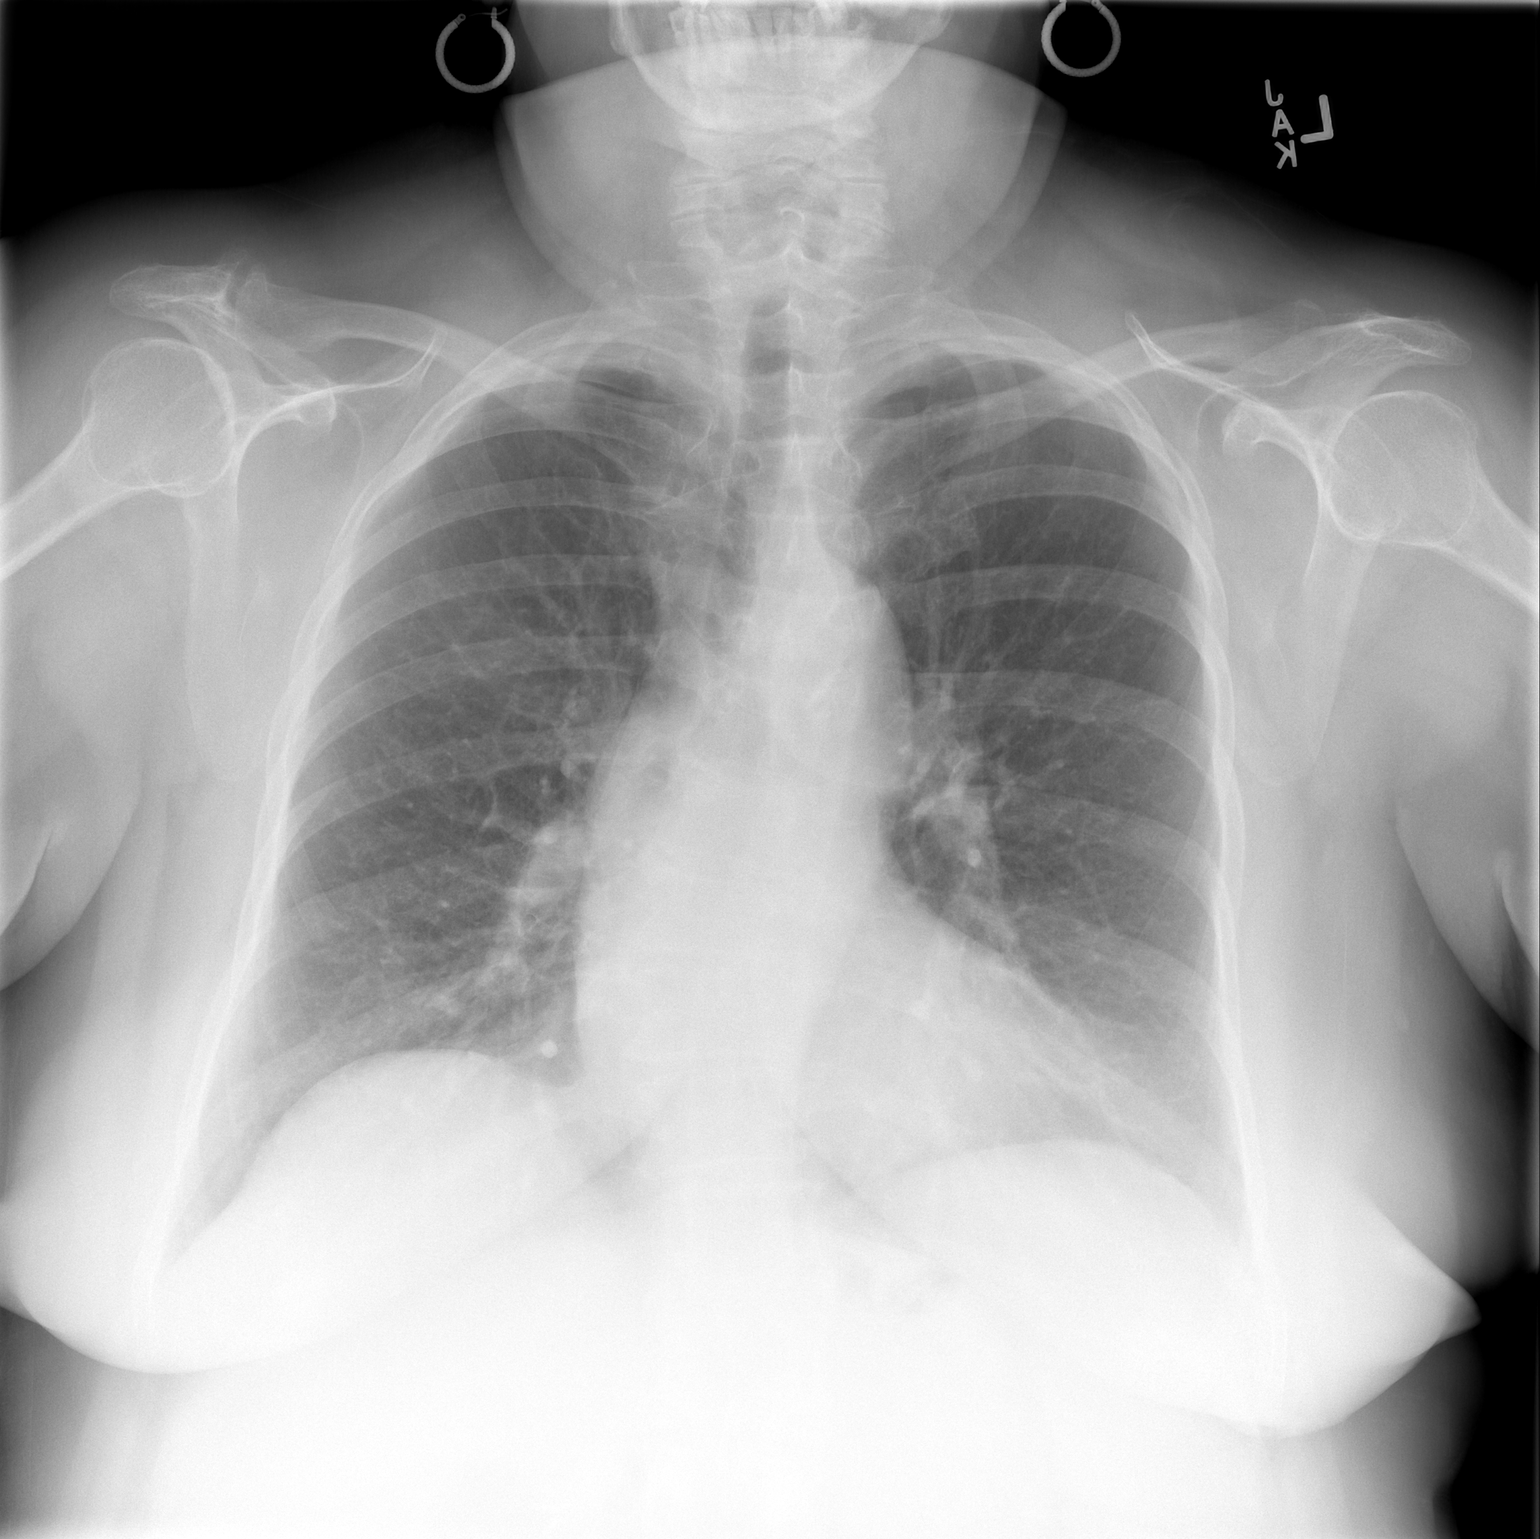

[w chest lat]
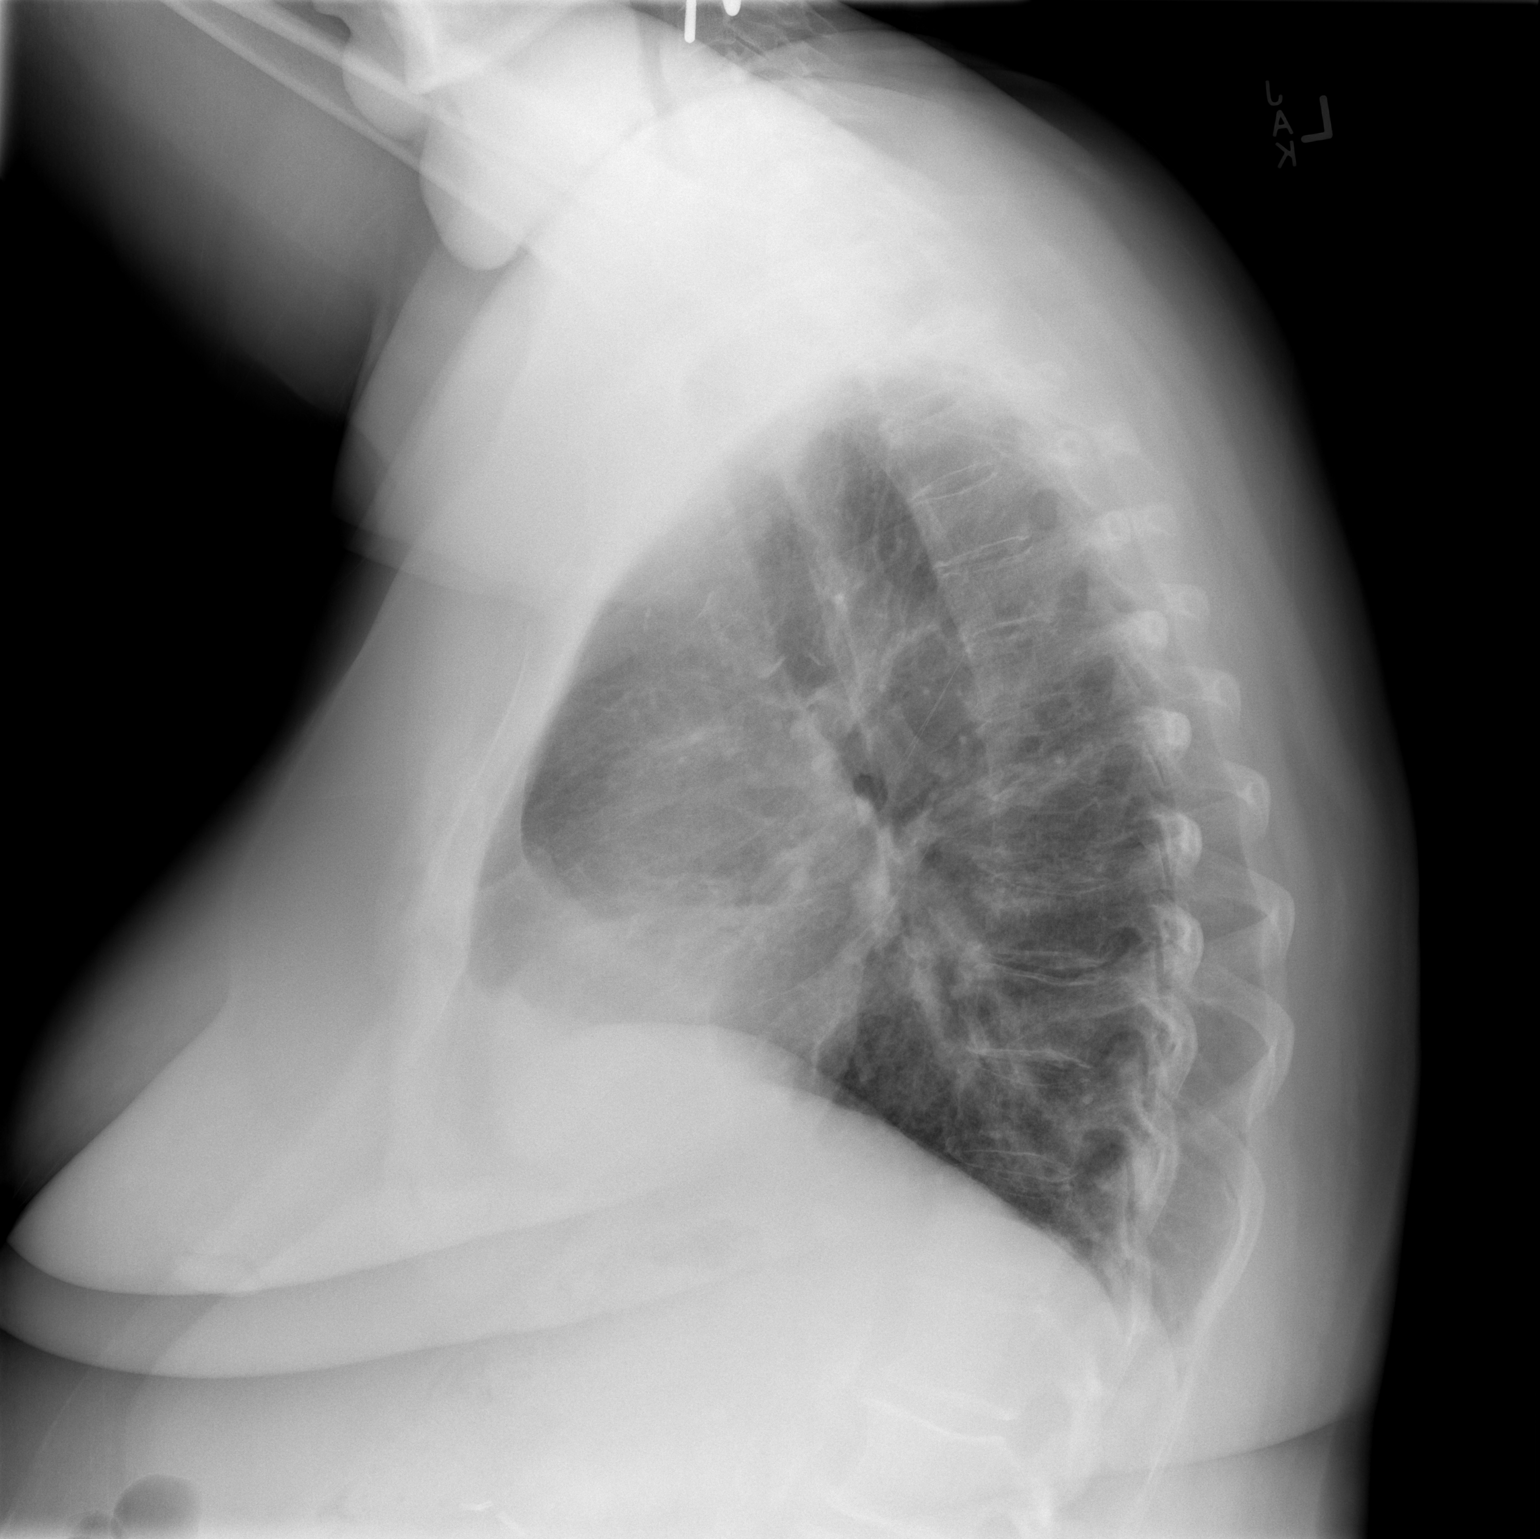

[2 of 2 positions shown; findings below may reference images not displayed]

FINDINGS: Normal mediastinum and cardiac silhouette. Normal pulmonary
vasculature. No evidence of effusion, infiltrate, or pneumothorax.
No acute bony abnormality.
IMPRESSION: No acute cardiopulmonary process.

## 2017-10-20 ENCOUNTER — Other Ambulatory Visit: Payer: Self-pay | Admitting: *Deleted

## 2017-10-20 DIAGNOSIS — I6523 Occlusion and stenosis of bilateral carotid arteries: Secondary | ICD-10-CM

## 2018-09-28 ENCOUNTER — Ambulatory Visit (HOSPITAL_COMMUNITY)
Admission: RE | Admit: 2018-09-28 | Discharge: 2018-09-28 | Disposition: A | Discharge status: Home / Self Care | Payer: Medicare Other | Source: Ambulatory Visit | Attending: Cardiology | Admitting: Cardiology

## 2018-09-28 ENCOUNTER — Encounter: Payer: Self-pay | Admitting: Physician Assistant

## 2018-09-28 DIAGNOSIS — I6523 Occlusion and stenosis of bilateral carotid arteries: Secondary | ICD-10-CM

## 2018-10-01 ENCOUNTER — Telehealth: Payer: Self-pay

## 2018-10-01 DIAGNOSIS — I6523 Occlusion and stenosis of bilateral carotid arteries: Secondary | ICD-10-CM

## 2018-10-01 NOTE — Telephone Encounter (Signed)
-----   Message from Beatrice Lecher, [place] sent at 09/28/2018  5:04 PM EDT ----- The carotid ultrasound shows that there is mod bilateral plaque (60-79% on the R and 40-59% on the L) in the carotid arteries.  This is unchanged from last year.  Recommendations:  - Continue current medications.  - Repeat Carotid US in 1 year.   - Send copy to PCP   Tereso Newcomer, PA-C    09/28/2018 5:01 PM

## 2018-10-01 NOTE — Telephone Encounter (Signed)
Notes recorded by Sigurd Sos, RN on 10/01/2018 at 9:07 AM EDT The patient has been notified of the result and verbalized understanding. All questions (if any) were answered. Sigurd Sos, RN 10/01/2018 9:07 AM

## 2018-10-03 ENCOUNTER — Encounter (HOSPITAL_COMMUNITY): Payer: Medicare Other

## 2022-01-20 ENCOUNTER — Other Ambulatory Visit (HOSPITAL_COMMUNITY): Payer: Self-pay

## 2022-01-20 MED ORDER — DEXCOM G6 TRANSMITTER MISC
0 refills | Status: DC
  Filled 2022-01-20: qty 1, 90d supply, fill #0

## 2022-01-20 MED ORDER — DEXCOM G6 SENSOR MISC
11 refills | Status: DC
  Filled 2022-01-20: qty 3, 30d supply, fill #0
  Filled 2022-02-22: qty 3, 30d supply, fill #1

## 2022-01-20 MED ORDER — DEXCOM G6 RECEIVER DEVI
3 refills | Status: DC
  Filled 2022-01-20: qty 1, 90d supply, fill #0

## 2022-02-22 ENCOUNTER — Other Ambulatory Visit (HOSPITAL_COMMUNITY): Payer: Self-pay

## 2022-02-23 ENCOUNTER — Other Ambulatory Visit (HOSPITAL_COMMUNITY): Payer: Self-pay

## 2022-03-24 ENCOUNTER — Other Ambulatory Visit (HOSPITAL_COMMUNITY): Payer: Self-pay

## 2022-03-24 MED ORDER — DEXCOM G7 RECEIVER DEVI
3 refills | Status: DC
  Filled 2022-03-24: qty 1, 90d supply, fill #0
  Filled 2022-04-01: qty 1, 1d supply, fill #0

## 2022-03-24 MED ORDER — DEXCOM G7 SENSOR MISC
3 refills | Status: DC
  Filled 2022-03-24: qty 9, 90d supply, fill #0
  Filled 2022-04-01: qty 3, 28d supply, fill #0
  Filled 2022-05-17: qty 3, 30d supply, fill #1
  Filled 2022-06-13: qty 3, 30d supply, fill #2
  Filled 2022-07-04: qty 3, 30d supply, fill #3
  Filled 2022-08-08: qty 3, 30d supply, fill #4
  Filled 2022-09-16: qty 3, 30d supply, fill #5
  Filled 2022-10-13: qty 3, 30d supply, fill #6
  Filled 2022-11-10: qty 3, 30d supply, fill #7
  Filled 2022-12-12: qty 3, 30d supply, fill #8
  Filled 2023-01-09: qty 3, 30d supply, fill #9
  Filled 2023-02-08: qty 3, 30d supply, fill #10
  Filled 2023-03-10: qty 3, 30d supply, fill #11

## 2022-03-24 MED ORDER — DEXCOM G7 RECEIVER DEVI
3 refills | Status: AC
  Filled 2022-03-24: qty 1, 90d supply, fill #0

## 2022-03-25 ENCOUNTER — Other Ambulatory Visit (HOSPITAL_COMMUNITY): Payer: Self-pay

## 2022-04-01 ENCOUNTER — Other Ambulatory Visit (HOSPITAL_COMMUNITY): Payer: Self-pay

## 2022-04-04 ENCOUNTER — Other Ambulatory Visit (HOSPITAL_COMMUNITY): Payer: Self-pay

## 2022-05-17 ENCOUNTER — Other Ambulatory Visit (HOSPITAL_COMMUNITY): Payer: Self-pay

## 2022-06-13 ENCOUNTER — Other Ambulatory Visit (HOSPITAL_COMMUNITY): Payer: Self-pay

## 2022-07-04 ENCOUNTER — Other Ambulatory Visit (HOSPITAL_COMMUNITY): Payer: Self-pay

## 2022-07-05 ENCOUNTER — Other Ambulatory Visit (HOSPITAL_COMMUNITY): Payer: Self-pay

## 2022-07-06 ENCOUNTER — Other Ambulatory Visit (HOSPITAL_COMMUNITY): Payer: Self-pay

## 2022-07-07 ENCOUNTER — Other Ambulatory Visit (HOSPITAL_COMMUNITY): Payer: Self-pay

## 2022-08-08 ENCOUNTER — Other Ambulatory Visit (HOSPITAL_COMMUNITY): Payer: Self-pay

## 2022-08-09 ENCOUNTER — Other Ambulatory Visit (HOSPITAL_COMMUNITY): Payer: Self-pay

## 2022-09-16 ENCOUNTER — Other Ambulatory Visit (HOSPITAL_COMMUNITY): Payer: Self-pay

## 2022-10-13 ENCOUNTER — Other Ambulatory Visit (HOSPITAL_COMMUNITY): Payer: Self-pay

## 2022-10-26 ENCOUNTER — Other Ambulatory Visit (HOSPITAL_COMMUNITY): Payer: Self-pay

## 2022-10-26 ENCOUNTER — Encounter: Payer: Self-pay | Admitting: Cardiovascular Disease

## 2022-10-26 ENCOUNTER — Ambulatory Visit: Payer: Medicare Other | Attending: Cardiovascular Disease | Admitting: Cardiovascular Disease

## 2022-10-26 VITALS — BP 146/72 | HR 80 | Ht 61.0 in | Wt 219.0 lb

## 2022-10-26 DIAGNOSIS — I38 Endocarditis, valve unspecified: Secondary | ICD-10-CM | POA: Insufficient documentation

## 2022-10-26 DIAGNOSIS — I6523 Occlusion and stenosis of bilateral carotid arteries: Secondary | ICD-10-CM | POA: Insufficient documentation

## 2022-10-26 DIAGNOSIS — I509 Heart failure, unspecified: Secondary | ICD-10-CM | POA: Diagnosis not present

## 2022-10-26 DIAGNOSIS — E782 Mixed hyperlipidemia: Secondary | ICD-10-CM | POA: Diagnosis not present

## 2022-10-26 DIAGNOSIS — I10 Essential (primary) hypertension: Secondary | ICD-10-CM | POA: Insufficient documentation

## 2022-10-26 MED ORDER — FUROSEMIDE 20 MG PO TABS: 20.0000 mg | ORAL_TABLET | Freq: Every day | ORAL | 3 refills | Status: DC

## 2022-10-26 MED ORDER — FUROSEMIDE 20 MG PO TABS
20.0000 mg | ORAL_TABLET | Freq: Every day | ORAL | 3 refills | Status: DC
  Filled 2022-10-26: qty 30, 30d supply, fill #0
  Filled 2022-10-26: qty 90, 90d supply, fill #0

## 2022-10-26 NOTE — Progress Notes (Unsigned)
Cardiology Office Note:    Date:  10/27/2022   ID:  Traci Stout, DOB 11/04/1946, MRN [id number]  PCP:  Aviva Kluver    [facility] Providers Cardiologist:  Tonny Bollman, MD     Referring MD: No ref. provider found   Chief Complaint  Patient presents with   Shortness of Breath    History of Present Illness:    Traci Stout is a 76 y.o. female presenting for follow-up evaluation after a long hiatus from our practice.  She was last seen here in 2017 and has been followed for moderate carotid artery stenosis, hypertension, and mixed hyperlipidemia.  She has been followed closely by her primary care physician in [place] where she lives permanently.  The patient recently notified her daughter who is a cardiovascular research nurse of progressive dyspnea.  The patient reports about 2 to 3 weeks of worsening shortness of breath, orthopnea, PND, and leg swelling.  She has no past history of similar symptoms.  She has had no chest pain or pressure.  She was started on furosemide by her primary care provider and symptoms began to improve.  She did have a significant cough that seem to worsen after she started furosemide.  She has had no fevers, chills, or other complaints.  She does have increased fatigue.  Her breathing has improved significantly after starting furosemide and she is now able to sleep in bed again.  She spent about 1 week sleeping in a reclining chair.  Past Medical History:  Diagnosis Date   Carotid artery disease (HCC)    a. Carotid US 9/17: R 60-79%; L 40-59% >> f/u 1 year // Carotid US 10/19: R 60-79; L 40-59, FU 1 year   Dyslipidemia    Dyspnea    GERD (gastroesophageal reflux disease)    HTN (hypertension)    Hypothyroidism    Osteoarthritis     Past Surgical History:  Procedure Laterality Date   CARDIAC CATHETERIZATION N/A 09/21/2016   Procedure: Left Heart Cath and Coronary Angiography;  Surgeon: Tonny Bollman, MD;  Location: [facility] INVASIVE CV LAB;   Service: Cardiovascular;  Laterality: N/A;   LAPAROSCOPIC CHOLECYSTECTOMY  2003    Current Medications: Current Meds  Medication Sig   albuterol (PROVENTIL HFA;VENTOLIN HFA) 108 (90 Base) MCG/ACT inhaler Inhale 2 puffs into the lungs every 6 (six) hours as needed for wheezing or shortness of breath.   amLODipine (NORVASC) 5 MG tablet Take 5 mg by mouth daily.   aspirin 81 MG tablet Take 81 mg by mouth as needed.   Continuous Blood Gluc Receiver (DEXCOM G6 RECEIVER) DEVI Use as directed   Continuous Blood Gluc Receiver (DEXCOM G7 RECEIVER) DEVI Use every 10 days   Continuous Blood Gluc Receiver (DEXCOM G7 RECEIVER) DEVI Use as directed every 90 days   Continuous Blood Gluc Sensor (DEXCOM G6 SENSOR) MISC Use as directed and change sensor every 10 days.   Continuous Blood Gluc Sensor (DEXCOM G7 SENSOR) MISC Use as directed   Continuous Blood Gluc Transmit (DEXCOM G6 TRANSMITTER) MISC Use as directed and change every 90 days.   furosemide (LASIX) 20 MG tablet Take 1 tablet (20 mg total) by mouth daily.   furosemide (LASIX) 20 MG tablet Take 1 tablet (20 mg total) by mouth daily.   LANTUS SOLOSTAR 100 UNIT/ML Solostar Pen Inject 54 Units into the skin daily at 10 pm.   levothyroxine (SYNTHROID, LEVOTHROID) 100 MCG tablet Take 100 mcg by mouth daily before breakfast.   losartan (COZAAR) 100  MG tablet Take 100 mg by mouth daily.   NOVOLOG FLEXPEN 100 UNIT/ML FlexPen Inject 2-4 Units into the skin 3 (three) times daily with meals. Per sliding scale   omeprazole (PRILOSEC) 20 MG capsule Take 20 mg by mouth daily.     rosuvastatin (CRESTOR) 40 MG tablet Take 0.5 tablets (20 mg total) by mouth daily.   TRULICITY 1.5 OI/7.1IW SOPN Inject 1.5 mg as directed once a week.    [DISCONTINUED] celecoxib (CELEBREX) 200 MG capsule Take 200 mg by mouth daily as needed for moderate pain.    [DISCONTINUED] olopatadine (PATANOL) 0.1 % ophthalmic solution Place 2 drops into both eyes 2 (two) times daily.       Allergies:   Codeine   Social History   Socioeconomic History   Marital status: Married    Spouse name: Not on file   Number of children: 3   Years of education: Not on file   Highest education level: Not on file  Occupational History   Occupation: disabled / retired  Tobacco Use   Smoking status: Former    Types: Cigarettes    Quit date: 09/25/2006    Years since quitting: 16.0   Smokeless tobacco: Never  Substance and Sexual Activity   Alcohol use: No   Drug use: Not on file   Sexual activity: Not on file  Other Topics Concern   Not on file  Social History Narrative   Not on file   Social Determinants of Health   Financial Resource Strain: Not on file  Food Insecurity: Not on file  Transportation Needs: Not on file  Physical Activity: Not on file  Stress: Not on file  Social Connections: Not on file     Family History: The patient's family history includes Coronary artery disease in her unknown relative; Heart failure in her father, mother, and unknown relative.  ROS:   Please see the history of present illness.    All other systems reviewed and are negative.  EKGs/Labs/Other Studies Reviewed:    The following studies were reviewed today: Cardiac catheterization 2017: Ost 1st Mrg to 1st Mrg lesion, 40 %stenosed. Prox LAD lesion, 30 %stenosed. Ost 1st Diag to 1st Diag lesion, 30 %stenosed.   1. 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 Normal/hyperdynamic LV systolic function with LVEF 70-75% and normal LVEDP   Suspect noncardiac symptoms. Continue risk reduction/medical therapy for nonobstructive CAD  EKG:  EKG is ordered today.  The ekg ordered today demonstrates normal sinus rhythm 80 bpm, possible age-indeterminate inferior infarct.  Recent Labs: 10/26/2022: ALT 39; BUN 13; Creatinine, Ser 0.89; Hemoglobin 13.6; NT-Pro BNP 173; Platelets 251; Potassium 4.0; Sodium 140  Recent Lipid Panel No results  found for: "CHOL", "TRIG", "HDL", "CHOLHDL", "VLDL", "LDLCALC", "LDLDIRECT"   Risk Assessment/Calculations:            Physical Exam:    VS:  BP (!) 146/72   Pulse 80   Ht 5\' 1"  (1.549 m)   Wt 219 lb (99.3 kg)   SpO2 95%   BMI 41.38 kg/m     Wt Readings from Last 3 Encounters:  10/26/22 219 lb (99.3 kg)  09/21/16 206 lb (93.4 kg)  09/20/16 206 lb (93.4 kg)     GEN:  Well nourished, well developed in no acute distress HEENT: Normal NECK: No JVD; No carotid bruits LYMPHATICS: No lymphadenopathy CARDIAC: RRR, 2/6 harsh systolic crescendo decrescendo murmur, mid peaking, at the right upper sternal border  RESPIRATORY:  Clear to auscultation without rales, wheezing or rhonchi  ABDOMEN: Soft, non-tender, non-distended MUSCULOSKELETAL: 1+ bilateral pretibial edema; No deformity  SKIN: Warm and dry NEUROLOGIC:  Alert and oriented x 3 PSYCHIATRIC:  Normal affect   ASSESSMENT:    1. Bilateral carotid artery stenosis   2. Primary hypertension   3. Mixed hyperlipidemia   4. 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  An updated carotid duplex scan will be ordered.  The patient will continue on her medical program which includes aspirin for antiplatelet therapy and rosuvastatin for lipid lowering. Blood pressure is mildly elevated.  We will continue amlodipine and losartan at this time.  Patient will continue to monitor. Treated with a high intensity statin drug (rosuvastatin 40 mg daily). A 2D echo was ordered.  I personally reviewed the echo images.  The patient's echo interpretation is pending.  By my assessment, the patient has vigorous LV systolic function with an LVEF greater than 65%.  I do not appreciate any regional wall  motion abnormalities.  The patient's aortic valve is poorly visualized but she appears to have mild aortic stenosis with a mean transvalvular gradient of 11 mmHg.  I do not appreciate any significant mitral or tricuspid regurgitation.  There is no significant pericardial effusion.  The patient will continue with oral diuretic therapy.  She has improved significantly with oral furosemide.  Labs will be drawn to include a metabolic panel and BNP.  The patient's BNP is normal and renal function is normal with a creatinine of 0.89.  We discussed the importance of weight management, increased physical activity which I suspect will be problematic due to her arthritis and deconditioning, as well as dietary modification with sodium restriction and a goal of weight loss.  All of this will be important continuing to control her diabetes and treat diastolic heart failure.     Medication Adjustments/Labs and Tests Ordered: Current medicines are reviewed at length with the patient today.  Concerns regarding medicines are outlined above.  Orders Placed This Encounter  Procedures   CBC   Comprehensive metabolic panel   Pro b natriuretic peptide (BNP)   EKG 12-Lead   ECHOCARDIOGRAM COMPLETE   VAS US CAROTID   Meds ordered this encounter  Medications   furosemide (LASIX) 20 MG tablet    Sig: Take 1 tablet (20 mg total) by mouth daily.    Dispense:  90 tablet    Refill:  3   furosemide (LASIX) 20 MG tablet    Sig: Take 1 tablet (20 mg total) by mouth daily.    Dispense:  90 tablet    Refill:  3    Patient Instructions  Medication Instructions:  START Furosemide 20mg  daily *If you need a refill on your cardiac medications before your next appointment, please call your pharmacy*   Lab Work: CBC, CMET, BNP If you have labs (blood work) drawn today and your tests are completely normal, you will receive your results only by: MyChart Message (if you have MyChart) OR A paper copy in the mail If you have  any lab test that is abnormal or we need to change your treatment, we will call you to review the results.   Testing/Procedures: ECHO Your physician  has requested that you have an echocardiogram. Echocardiography is a painless test that uses sound waves to create images of your heart. It provides your doctor with information about the size and shape of your heart and how well your heart's chambers and valves are working. This procedure takes approximately one hour. There are no restrictions for this procedure. Please do NOT wear cologne, perfume, aftershave, or lotions (deodorant is allowed). Please arrive 15 minutes prior to your appointment time.  Carotid Ultrasound Your physician has requested that you have a carotid duplex. This test is an ultrasound of the carotid arteries in your neck. It looks at blood flow through these arteries that supply the brain with blood. Allow one hour for this exam. There are no restrictions or special instructions.  Follow-Up: At [facility], you and your health needs are our priority.  As part of our continuing mission to provide you with exceptional heart care, we have created designated Provider Care Teams.  These Care Teams include your primary Cardiologist (physician) and Advanced Practice Providers (APPs -  Physician Assistants and Nurse Practitioners) who all work together to provide you with the care you need, when you need it.  Your next appointment:   6 months  The format for your next appointment:   In Person  Provider:   Tonny Bollman, MD    Important Information About Sugar         Signed, Tonny Bollman, MD  10/27/2022 4:06 PM    [facility]

## 2022-10-26 NOTE — Patient Instructions (Signed)
Medication Instructions:  START Furosemide 20mg  daily *If you need a refill on your cardiac medications before your next appointment, please call your pharmacy*   Lab Work: CBC, CMET, BNP If you have labs (blood work) drawn today and your tests are completely normal, you will receive your results only by: [place] (if you have MyChart) OR A paper copy in the mail If you have any lab test that is abnormal or we need to change your treatment, we will call you to review the results.   Testing/Procedures: ECHO Your physician has requested that you have an echocardiogram. Echocardiography is a painless test that uses sound waves to create images of your heart. It provides your doctor with information about the size and shape of your heart and how well your heart's chambers and valves are working. This procedure takes approximately one hour. There are no restrictions for this procedure. Please do NOT wear cologne, perfume, aftershave, or lotions (deodorant is allowed). Please arrive 15 minutes prior to your appointment time.  Carotid Ultrasound Your physician has requested that you have a carotid duplex. This test is an ultrasound of the carotid arteries in your neck. It looks at blood flow through these arteries that supply the brain with blood. Allow one hour for this exam. There are no restrictions or special instructions.  Follow-Up: At [facility], you and your health needs are our priority.  As part of our continuing mission to provide you with exceptional heart care, we have created designated Provider Care Teams.  These Care Teams include your primary Cardiologist (physician) and Advanced Practice Providers (APPs -  Physician Assistants and Nurse Practitioners) who all work together to provide you with the care you need, when you need it.  Your next appointment:   6 months  The format for your next appointment:   In Person  Provider:   Sherren Mocha, MD     Important Information About Sugar

## 2022-10-27 ENCOUNTER — Ambulatory Visit (HOSPITAL_BASED_OUTPATIENT_CLINIC_OR_DEPARTMENT_OTHER)
Admission: RE | Admit: 2022-10-27 | Discharge: 2022-10-27 | Disposition: A | Discharge status: Home / Self Care | Payer: Medicare Other | Source: Ambulatory Visit | Attending: Cardiovascular Disease | Admitting: Cardiovascular Disease

## 2022-10-27 ENCOUNTER — Ambulatory Visit (HOSPITAL_COMMUNITY)
Admission: RE | Admit: 2022-10-27 | Discharge: 2022-10-27 | Disposition: A | Discharge status: Home / Self Care | Payer: Medicare Other | Source: Ambulatory Visit | Attending: Cardiovascular Disease | Admitting: Cardiovascular Disease

## 2022-10-27 ENCOUNTER — Encounter: Payer: Self-pay | Admitting: Cardiovascular Disease

## 2022-10-27 DIAGNOSIS — I509 Heart failure, unspecified: Secondary | ICD-10-CM | POA: Insufficient documentation

## 2022-10-27 DIAGNOSIS — Z87891 Personal history of nicotine dependence: Secondary | ICD-10-CM | POA: Diagnosis not present

## 2022-10-27 DIAGNOSIS — I6523 Occlusion and stenosis of bilateral carotid arteries: Secondary | ICD-10-CM

## 2022-10-27 DIAGNOSIS — I083 Combined rheumatic disorders of mitral, aortic and tricuspid valves: Secondary | ICD-10-CM | POA: Insufficient documentation

## 2022-10-27 DIAGNOSIS — I10 Essential (primary) hypertension: Secondary | ICD-10-CM

## 2022-10-27 DIAGNOSIS — E782 Mixed hyperlipidemia: Secondary | ICD-10-CM | POA: Insufficient documentation

## 2022-10-27 DIAGNOSIS — I38 Endocarditis, valve unspecified: Secondary | ICD-10-CM | POA: Diagnosis not present

## 2022-10-27 DIAGNOSIS — I110 Hypertensive heart disease with heart failure: Secondary | ICD-10-CM | POA: Diagnosis not present

## 2022-10-27 LAB — COMPREHENSIVE METABOLIC PANEL
ALT: 39 IU/L — ABNORMAL HIGH (ref 0–32)
AST: 61 IU/L — ABNORMAL HIGH (ref 0–40)
Albumin/Globulin Ratio: 2.2 (ref 1.2–2.2)
Albumin: 4.7 g/dL (ref 3.8–4.8)
Alkaline Phosphatase: 77 IU/L (ref 44–121)
BUN/Creatinine Ratio: 15 (ref 12–28)
BUN: 13 mg/dL (ref 8–27)
Bilirubin Total: 0.8 mg/dL (ref 0.0–1.2)
CO2: 26 mmol/L (ref 20–29)
Calcium: 9.2 mg/dL (ref 8.7–10.3)
Chloride: 97 mmol/L (ref 96–106)
Creatinine, Ser: 0.89 mg/dL (ref 0.57–1.00)
Globulin, Total: 2.1 g/dL (ref 1.5–4.5)
Glucose: 176 mg/dL — ABNORMAL HIGH (ref 70–99)
Potassium: 4 mmol/L (ref 3.5–5.2)
Sodium: 140 mmol/L (ref 134–144)
Total Protein: 6.8 g/dL (ref 6.0–8.5)
eGFR: 66 mL/min/{1.73_m2} (ref >59)

## 2022-10-27 LAB — CBC
Hematocrit: 44.2 % (ref 34.0–46.6)
Hemoglobin: 13.6 g/dL (ref 11.1–15.9)
MCH: 26 pg — ABNORMAL LOW (ref 26.6–33.0)
MCHC: 30.8 g/dL — ABNORMAL LOW (ref 31.5–35.7)
MCV: 85 fL (ref 79–97)
Platelets: 251 10*3/uL (ref 150–450)
RBC: 5.23 x10E6/uL (ref 3.77–5.28)
RDW: 15.1 % (ref 11.7–15.4)
WBC: 11.5 10*3/uL — ABNORMAL HIGH (ref 3.4–10.8)

## 2022-10-27 LAB — ECHOCARDIOGRAM COMPLETE
AR max vel: 2.03 cm2
AV Area VTI: 2.21 cm2
AV Area mean vel: 2.04 cm2
AV Mean grad: 10 mmHg
AV Peak grad: 17.7 mmHg
Ao pk vel: 2.1 m/s
Area-P 1/2: 3.21 cm2
MV VTI: 2.42 cm2
S' Lateral: 3.5 cm

## 2022-10-27 LAB — PRO B NATRIURETIC PEPTIDE: NT-Pro BNP: 173 pg/mL (ref 0–738)

## 2022-10-27 NOTE — Progress Notes (Signed)
  Echocardiogram 2D Echocardiogram has been performed.  Darlina Sicilian M 10/27/2022, 12:23 PM

## 2022-10-31 ENCOUNTER — Other Ambulatory Visit (HOSPITAL_COMMUNITY): Payer: Self-pay

## 2022-10-31 ENCOUNTER — Telehealth: Payer: Self-pay | Admitting: Cardiovascular Disease

## 2022-10-31 DIAGNOSIS — Z79899 Other long term (current) drug therapy: Secondary | ICD-10-CM

## 2022-10-31 MED ORDER — EMPAGLIFLOZIN 10 MG PO TABS
10.0000 mg | ORAL_TABLET | Freq: Every day | ORAL | 0 refills | Status: DC
  Filled 2022-10-31: qty 30, 30d supply, fill #0

## 2022-10-31 MED ORDER — FUROSEMIDE 20 MG PO TABS
20.0000 mg | ORAL_TABLET | Freq: Every day | ORAL | 1 refills | Status: DC | PRN
  Filled 2022-10-31: qty 90, fill #0

## 2022-10-31 MED ORDER — EMPAGLIFLOZIN 10 MG PO TABS: 10.0000 mg | ORAL_TABLET | Freq: Every day | ORAL | 3 refills | Status: DC

## 2022-10-31 NOTE — Telephone Encounter (Signed)
Called and spoke to daughter on DPR to inform her that I have sent medication into pharmacies as requested. Labs have been entered and scheduled for 6 months, but she understands that the date can be changed closer to time as needed. No further questions.

## 2022-10-31 NOTE — Telephone Encounter (Signed)
-----   Message from Sherren Mocha, MD sent at 10/31/2022  2:20 PM EST ----- Jasmine Pang can you call in a Jardiance 10 mg daily prescription for her?  She would like to get started, so probably best to call in a 30-day prescription to the [facility] outpatient pharmacy and then a mail order prescription for her ongoing use when she is back in [place].  Okay to change her furosemide to as needed dosing for weight gain of 3 to 5 pounds from baseline.  We will keep her follow-up the same in 6 months and she should have a repeat metabolic panel with liver function test when she returns.  Thanks

## 2022-11-10 ENCOUNTER — Other Ambulatory Visit (HOSPITAL_COMMUNITY): Payer: Self-pay

## 2022-11-11 ENCOUNTER — Other Ambulatory Visit (HOSPITAL_COMMUNITY): Payer: Self-pay

## 2022-12-20 ENCOUNTER — Ambulatory Visit: Payer: Medicare Other | Attending: Cardiovascular Disease

## 2022-12-20 DIAGNOSIS — Z79899 Other long term (current) drug therapy: Secondary | ICD-10-CM

## 2022-12-21 LAB — COMPREHENSIVE METABOLIC PANEL
ALT: 28 IU/L (ref 0–32)
AST: 28 IU/L (ref 0–40)
Albumin/Globulin Ratio: 1.8 (ref 1.2–2.2)
Albumin: 4.1 g/dL (ref 3.8–4.8)
Alkaline Phosphatase: 65 IU/L (ref 44–121)
BUN/Creatinine Ratio: 15 (ref 12–28)
BUN: 14 mg/dL (ref 8–27)
Bilirubin Total: 0.7 mg/dL (ref 0.0–1.2)
CO2: 21 mmol/L (ref 20–29)
Calcium: 9.1 mg/dL (ref 8.7–10.3)
Chloride: 104 mmol/L (ref 96–106)
Creatinine, Ser: 0.91 mg/dL (ref 0.57–1.00)
Globulin, Total: 2.3 g/dL (ref 1.5–4.5)
Glucose: 163 mg/dL — ABNORMAL HIGH (ref 70–99)
Potassium: 4 mmol/L (ref 3.5–5.2)
Sodium: 141 mmol/L (ref 134–144)
Total Protein: 6.4 g/dL (ref 6.0–8.5)
eGFR: 65 mL/min/{1.73_m2} (ref >59)

## 2022-12-21 LAB — HEPATIC FUNCTION PANEL: Bilirubin, Direct: 0.21 mg/dL (ref 0.00–0.40)

## 2023-02-08 ENCOUNTER — Other Ambulatory Visit (HOSPITAL_COMMUNITY): Payer: Self-pay

## 2023-04-07 ENCOUNTER — Other Ambulatory Visit (HOSPITAL_COMMUNITY): Payer: Self-pay

## 2023-04-10 ENCOUNTER — Other Ambulatory Visit (HOSPITAL_COMMUNITY): Payer: Self-pay

## 2023-04-10 MED ORDER — DEXCOM G7 SENSOR MISC
11 refills | Status: AC
  Filled 2023-04-10 – 2023-04-12 (×2): qty 3, 30d supply, fill #0
  Filled 2023-05-09 (×2): qty 3, 30d supply, fill #1
  Filled 2023-06-15: qty 3, 30d supply, fill #2
  Filled 2023-07-04 – 2023-07-11 (×4): qty 3, 30d supply, fill #3
  Filled 2023-08-15: qty 3, 30d supply, fill #4
  Filled 2023-09-12: qty 3, 30d supply, fill #5
  Filled 2023-10-10: qty 3, 30d supply, fill #6
  Filled 2023-11-09: qty 3, 30d supply, fill #7
  Filled 2023-12-15 – 2023-12-18 (×2): qty 3, 30d supply, fill #8
  Filled 2024-01-12: qty 3, 30d supply, fill #9
  Filled 2024-02-09 (×3): qty 3, 30d supply, fill #10
  Filled 2024-03-12: qty 3, 30d supply, fill #11

## 2023-04-12 ENCOUNTER — Other Ambulatory Visit: Payer: Self-pay

## 2023-04-12 ENCOUNTER — Other Ambulatory Visit (HOSPITAL_COMMUNITY): Payer: Self-pay

## 2023-04-21 ENCOUNTER — Encounter: Payer: Self-pay | Admitting: Cardiovascular Disease

## 2023-04-24 ENCOUNTER — Other Ambulatory Visit (HOSPITAL_COMMUNITY): Payer: Self-pay

## 2023-04-24 ENCOUNTER — Encounter: Payer: Self-pay | Admitting: Cardiovascular Disease

## 2023-04-24 ENCOUNTER — Ambulatory Visit: Payer: Medicare Other | Attending: Cardiovascular Disease | Admitting: Cardiovascular Disease

## 2023-04-24 VITALS — BP 124/80 | HR 87 | Ht 61.0 in | Wt 208.0 lb

## 2023-04-24 DIAGNOSIS — Z79899 Other long term (current) drug therapy: Secondary | ICD-10-CM | POA: Diagnosis present

## 2023-04-24 DIAGNOSIS — Z6835 Body mass index (BMI) 35.0-35.9, adult: Secondary | ICD-10-CM | POA: Diagnosis present

## 2023-04-24 DIAGNOSIS — I5032 Chronic diastolic (congestive) heart failure: Secondary | ICD-10-CM

## 2023-04-24 DIAGNOSIS — I6523 Occlusion and stenosis of bilateral carotid arteries: Secondary | ICD-10-CM

## 2023-04-24 DIAGNOSIS — E782 Mixed hyperlipidemia: Secondary | ICD-10-CM | POA: Diagnosis not present

## 2023-04-24 DIAGNOSIS — I10 Essential (primary) hypertension: Secondary | ICD-10-CM | POA: Diagnosis not present

## 2023-04-24 MED ORDER — FLUCONAZOLE 150 MG PO TABS
150.0000 mg | ORAL_TABLET | Freq: Every day | ORAL | 0 refills | Status: DC
  Filled 2023-04-24: qty 1, 1d supply, fill #0

## 2023-04-24 NOTE — Patient Instructions (Signed)
Medication Instructions:  TAKE Fluconazole 150mg  once *If you need a refill on your cardiac medications before your next appointment, please call your pharmacy*   Lab Work: NONE If you have labs (blood work) drawn today and your tests are completely normal, you will receive your results only by: MyChart Message (if you have MyChart) OR A paper copy in the mail If you have any lab test that is abnormal or we need to change your treatment, we will call you to review the results.   Testing/Procedures: Ambulatory referral to PharmD (GLP-1)   Follow-Up: At [facility], you and your health needs are our priority.  As part of our continuing mission to provide you with exceptional heart care, we have created designated Provider Care Teams.  These Care Teams include your primary Cardiologist (physician) and Advanced Practice Providers (APPs -  Physician Assistants and Nurse Practitioners) who all work together to provide you with the care you need, when you need it.  We recommend signing up for the patient portal called "MyChart".  Sign up information is provided on this After Visit Summary.  MyChart is used to connect with patients for Virtual Visits (Telemedicine).  Patients are able to view lab/test results, encounter notes, upcoming appointments, etc.  Non-urgent messages can be sent to your provider as well.   To learn more about what you can do with MyChart, go to [email or web address].    Your next appointment:   6 month(s)  Provider:   Tonny Bollman, MD

## 2023-04-24 NOTE — Telephone Encounter (Signed)
Pt had office visit with Dr Excell Seltzer this AM, symptoms addressed.

## 2023-04-24 NOTE — Progress Notes (Signed)
Cardiology Office Note:    Date:  04/24/2023   ID:  Traci Stout, DOB 01/15/1946, MRN [id number]  PCP:  Aviva Kluver   [facility] Providers Cardiologist:  Tonny Bollman, MD     Referring MD: No ref. provider found   Chief Complaint  Patient presents with   Hypertension    History of Present Illness:    Traci Stout is a 77 y.o. female with a hx of carotid stenosis without history of stroke, hypertension, and mixed hyperlipidemia, and diastolic heart failure, presenting for follow-up evaluation.  The patient is here with her daughter today.  She is been getting along pretty well.  I started her on Jardiance for diastolic heart failure when I saw her back in November 2023.  She just recently has started having problems with a yeast infection over the last 2 weeks.  She otherwise has no complaints today.  She reports stable, mild dyspnea with exertion.  No orthopnea, PND, or chest pain.  No leg edema.  Overall feeling well.  Past Medical History:  Diagnosis Date   Carotid artery disease (HCC)    a. Carotid US 9/17: R 60-79%; L 40-59% >> f/u 1 year // Carotid US 10/19: R 60-79; L 40-59, FU 1 year   Dyslipidemia    Dyspnea    GERD (gastroesophageal reflux disease)    HTN (hypertension)    Hypothyroidism    Osteoarthritis     Past Surgical History:  Procedure Laterality Date   CARDIAC CATHETERIZATION N/A 09/21/2016   Procedure: Left Heart Cath and Coronary Angiography;  Surgeon: Tonny Bollman, MD;  Location: [facility] INVASIVE CV LAB;  Service: Cardiovascular;  Laterality: N/A;   LAPAROSCOPIC CHOLECYSTECTOMY  2003    Current Medications: Current Meds  Medication Sig   albuterol (PROVENTIL HFA;VENTOLIN HFA) 108 (90 Base) MCG/ACT inhaler Inhale 2 puffs into the lungs every 6 (six) hours as needed for wheezing or shortness of breath.   amLODipine (NORVASC) 5 MG tablet Take 5 mg by mouth daily.   aspirin 81 MG tablet Take 81 mg by mouth as needed.   Continuous Blood Gluc  Receiver (DEXCOM G6 RECEIVER) DEVI Use as directed   Continuous Blood Gluc Receiver (DEXCOM G7 RECEIVER) DEVI Use every 10 days   Continuous Blood Gluc Receiver (DEXCOM G7 RECEIVER) DEVI Use as directed every 90 days   Continuous Blood Gluc Sensor (DEXCOM G6 SENSOR) MISC Use as directed and change sensor every 10 days.   Continuous Blood Gluc Transmit (DEXCOM G6 TRANSMITTER) MISC Use as directed and change every 90 days.   Continuous Glucose Sensor (DEXCOM G7 SENSOR) MISC Change sensor every 10 days   empagliflozin (JARDIANCE) 10 MG TABS tablet Take 1 tablet (10 mg total) by mouth daily before breakfast.   fluconazole (DIFLUCAN) 150 MG tablet Take 1 tablet (150 mg total) by mouth daily.   furosemide (LASIX) 20 MG tablet Take 1 tablet (20 mg total) by mouth daily as needed for weight gain greater than 3-5 pounds from baseline/usual weight   LANTUS SOLOSTAR 100 UNIT/ML Solostar Pen Inject 54 Units into the skin daily at 10 pm.   levothyroxine (SYNTHROID, LEVOTHROID) 100 MCG tablet Take 100 mcg by mouth daily before breakfast.   losartan (COZAAR) 100 MG tablet Take 100 mg by mouth daily.   NOVOLOG FLEXPEN 100 UNIT/ML FlexPen Inject 2-4 Units into the skin 3 (three) times daily with meals. Per sliding scale   omeprazole (PRILOSEC) 20 MG capsule Take 20 mg by mouth daily.  rosuvastatin (CRESTOR) 40 MG tablet Take 0.5 tablets (20 mg total) by mouth daily.   TRULICITY 1.5 MG/0.5ML SOPN Inject 1.5 mg as directed once a week.    [DISCONTINUED] empagliflozin (JARDIANCE) 10 MG TABS tablet Take 1 tablet (10 mg total) by mouth daily before breakfast.     Allergies:   Codeine   Social History   Socioeconomic History   Marital status: Married    Spouse name: Not on file   Number of children: 3   Years of education: Not on file   Highest education level: Not on file  Occupational History   Occupation: disabled / retired  Tobacco Use   Smoking status: Former    Types: Cigarettes    Quit date:  09/25/2006    Years since quitting: 16.5   Smokeless tobacco: Never  Substance and Sexual Activity   Alcohol use: No   Drug use: Not on file   Sexual activity: Not on file  Other Topics Concern   Not on file  Social History Narrative   Not on file   Social Determinants of Health   Financial Resource Strain: Not on file  Food Insecurity: Not on file  Transportation Needs: Not on file  Physical Activity: Not on file  Stress: Not on file  Social Connections: Not on file     Family History: The patient's family history includes Coronary artery disease in her unknown relative; Heart failure in her father, mother, and unknown relative.  ROS:   Please see the history of present illness.    Limited by knee problems.  All other systems reviewed and are negative.  EKGs/Labs/Other Studies Reviewed:    The following studies were reviewed today: Cardiac Studies & Procedures   CARDIAC CATHETERIZATION  CARDIAC CATHETERIZATION 09/21/2016  Narrative  Ost 1st Mrg to 1st Mrg lesion, 40 %stenosed.  Prox LAD lesion, 30 %stenosed.  Ost 1st Diag to 1st Diag lesion, 30 %stenosed.  1. 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 Normal/hyperdynamic LV systolic function with LVEF 70-75% and normal LVEDP  Suspect noncardiac symptoms. Continue risk reduction/medical therapy for nonobstructive CAD  Findings Coronary Findings Diagnostic  Dominance: Right  Left Main Vessel is angiographically normal.  Left Anterior Descending  First Diagonal Branch  Left Circumflex Vessel is angiographically normal.  First Obtuse Marginal Branch ostial lesion, nonobstructive  Second Obtuse Marginal Branch Vessel is angiographically normal.  Third Obtuse Marginal Branch Vessel is angiographically normal.  Right Coronary Artery Large, dominant vessel with diffuse calcification but no stenosis or irregularity visualized.  Intervention  No  interventions have been documented.     ECHOCARDIOGRAM  ECHOCARDIOGRAM COMPLETE 10/27/2022  Narrative ECHOCARDIOGRAM REPORT    Patient Name:   Traci Stout Date of Exam: 10/27/2022 Medical Rec #:  [id number]      Height:       61.0 in Accession #:    [id number]     Weight:       219.0 lb Date of Birth:  1946/09/05      BSA:          1.963 m Patient Age:    76 years       BP:           146/72 mmHg Patient Gender: F              HR:           87 bpm. Exam Location:  Outpatient  Procedure: 2D Echo, Cardiac  Doppler, Color Doppler and Strain Analysis  Indications:    Bilateral carotid artery stenosis [I65.23 (ICD-10-CM)]; Primary hypertension [I10 (ICD-10-CM)]; Mixed hyperlipidemia [E78.2 (ICD-10-CM)]; Heart failure due to valvular disease, unspecified heart failure type (HCC) [I50.9, I38 (ICD-10-CM)]  History:        Patient has no prior history of Echocardiogram examinations. Carotid Disease; Risk Factors:Hypertension.  Sonographer:    Leta Jungling RDCS Referring Phys: [phone] Ina Poupard   Sonographer Comments: Global longitudinal strain was attempted. IMPRESSIONS   1. Left ventricular ejection fraction, by estimation, is 65 to 70%. The left ventricle has normal function. The left ventricle has no regional wall motion abnormalities. There is mild left ventricular hypertrophy. Left ventricular diastolic parameters are indeterminate. Elevated left ventricular end-diastolic pressure. 2. Right ventricular systolic function is normal. The right ventricular size is normal. Tricuspid regurgitation signal is inadequate for assessing PA pressure. 3. The mitral valve was not well visualized. Trivial mitral valve regurgitation. Severe mitral annular calcification. 4. The aortic valve was not well visualized. There is moderate calcification of the aortic valve. There is mild thickening of the aortic valve. Aortic valve regurgitation is not visualized. Mild aortic valve stenosis. Aortic  valve mean gradient measures 10.0 mmHg. 5. The inferior vena cava is normal in size with greater than 50% respiratory variability, suggesting right atrial pressure of 3 mmHg.  Comparison(s): No prior Echocardiogram.  Conclusion(s)/Recommendation(s): Technically limited images. Aortic sclerosis with mild aortic stenosis. Normal LVEF; while diastology is technically indeterminate, LV E/e' is elevated, suggesting elevated filling pressure. RA pressure is normal.  FINDINGS Left Ventricle: Left ventricular ejection fraction, by estimation, is 65 to 70%. The left ventricle has normal function. The left ventricle has no regional wall motion abnormalities. Global longitudinal strain performed but not reported based on interpreter judgement due to suboptimal tracking. The left ventricular internal cavity size was normal in size. There is mild left ventricular hypertrophy. Left ventricular diastolic parameters are indeterminate. Elevated left ventricular end-diastolic pressure. The E/e' is 19.5.  Right Ventricle: The right ventricular size is normal. Right vetricular wall thickness was not well visualized. Right ventricular systolic function is normal. Tricuspid regurgitation signal is inadequate for assessing PA pressure.  Left Atrium: Left atrial size was normal in size.  Right Atrium: Right atrial size was normal in size.  Pericardium: There is no evidence of pericardial effusion.  Mitral Valve: The mitral valve was not well visualized. Severe mitral annular calcification. Trivial mitral valve regurgitation. MV peak gradient, 7.3 mmHg. The mean mitral valve gradient is 3.3 mmHg.  Tricuspid Valve: The tricuspid valve is not well visualized. Tricuspid valve regurgitation is trivial. No evidence of tricuspid stenosis.  Aortic Valve: The aortic valve was not well visualized. There is moderate calcification of the aortic valve. There is mild thickening of the aortic valve. Aortic valve regurgitation is  not visualized. Mild aortic stenosis is present. Aortic valve mean gradient measures 10.0 mmHg. Aortic valve peak gradient measures 17.7 mmHg. Aortic valve area, by VTI measures 2.21 cm.  Pulmonic Valve: The pulmonic valve was not well visualized. Pulmonic valve regurgitation is not visualized.  Aorta: The aortic root was not well visualized, the ascending aorta was not well visualized and the aortic arch was not well visualized.  Venous: The inferior vena cava is normal in size with greater than 50% respiratory variability, suggesting right atrial pressure of 3 mmHg.  IAS/Shunts: The interatrial septum was not well visualized.   LEFT VENTRICLE PLAX 2D LVIDd:         4.20  cm   Diastology LVIDs:         3.50 cm   LV e' medial:    4.62 cm/s LV PW:         1.00 cm   LV E/e' medial:  20.1 LV IVS:        1.10 cm   LV e' lateral:   4.87 cm/s LVOT diam:     1.80 cm   LV E/e' lateral: 19.0 LV SV:         73 LV SV Index:   37 LVOT Area:     2.54 cm   RIGHT VENTRICLE RV Basal diam:  2.40 cm RV Mid diam:    1.80 cm RV S prime:     12.20 cm/s TAPSE (M-mode): 1.6 cm  LEFT ATRIUM             Index        RIGHT ATRIUM          Index LA diam:        3.80 cm 1.94 cm/m   RA Area:     6.88 cm LA Vol (A2C):   41.3 ml 21.04 ml/m  RA Volume:   13.30 ml 6.78 ml/m LA Vol (A4C):   38.3 ml 19.51 ml/m LA Biplane Vol: 40.7 ml 20.73 ml/m AORTIC VALVE AV Area (Vmax):    2.03 cm AV Area (Vmean):   2.04 cm AV Area (VTI):     2.21 cm AV Vmax:           210.33 cm/s AV Vmean:          146.000 cm/s AV VTI:            0.329 m AV Peak Grad:      17.7 mmHg AV Mean Grad:      10.0 mmHg LVOT Vmax:         168.00 cm/s LVOT Vmean:        117.000 cm/s LVOT VTI:          0.285 m LVOT/AV VTI ratio: 0.87  AORTA Ao Root diam: 2.60 cm  MITRAL VALVE MV Area (PHT): 3.21 cm     SHUNTS MV Area VTI:   2.42 cm     Systemic VTI:  0.29 m MV Peak grad:  7.3 mmHg     Systemic Diam: 1.80 cm MV Mean grad:   3.3 mmHg MV Vmax:       1.35 m/s MV Vmean:      82.6 cm/s MV Decel Time: 236 msec MV E velocity: 92.70 cm/s MV A velocity: 139.00 cm/s MV E/A ratio:  0.67  Jodelle Red MD Electronically signed by Jodelle Red MD Signature Date/Time: 10/27/2022/5:47:51 PM    Final              EKG:  EKG is not ordered today.    Recent Labs: 10/26/2022: Hemoglobin 13.6; NT-Pro BNP 173; Platelets 251 12/20/2022: ALT 28; BUN 14; Creatinine, Ser 0.91; Potassium 4.0; Sodium 141  Recent Lipid Panel No results found for: "CHOL", "TRIG", "HDL", "CHOLHDL", "VLDL", "LDLCALC", "LDLDIRECT"   Risk Assessment/Calculations:                Physical Exam:    VS:  BP 124/80   Pulse 87   Ht 5\' 1"  (1.549 m)   Wt 208 lb (94.3 kg)   SpO2 96%   BMI 39.30 kg/m     Wt Readings from Last 3 Encounters:  04/24/23 208 lb (94.3 kg)  10/26/22 219 lb (99.3 kg)  09/21/16 206 lb (93.4 kg)     GEN:  Well nourished, well developed in no acute distress HEENT: Normal NECK: No JVD; No carotid bruits LYMPHATICS: No lymphadenopathy CARDIAC: RRR, no murmurs, rubs, gallops RESPIRATORY:  Clear to auscultation without rales, wheezing or rhonchi  ABDOMEN: Soft, non-tender, non-distended MUSCULOSKELETAL:  trace bilateral pretibial edema; No deformity  SKIN: Warm and dry NEUROLOGIC:  Alert and oriented x 3 PSYCHIATRIC:  Normal affect   ASSESSMENT:    1. Primary hypertension   2. Bilateral carotid artery stenosis   3. Mixed hyperlipidemia   4. Chronic diastolic heart failure (HCC)   5. Severe obesity (BMI 35.0-35.9 with comorbidity) (HCC)   6. Medication management    PLAN:    In order of problems listed above:  Blood pressure well-controlled on amlodipine and losartan.  Continue the same. Moderate carotid stenosis with no history of stroke.  Continue medical therapy which includes aspirin and a statin drug. Treated with rosuvastatin 40 mg daily.  Lipids followed by PCP.  Goal LDL  cholesterol less than 70 mg/dL. Will give fluconazole 150 mg x 1 to treat her yeast infection, likely related to Jardiance.  If recurrent yeast infection, will likely have to discontinue this medicine.  Otherwise continue furosemide, sodium restriction, weight management.  Overall stable with NYHA functional class II symptoms. Referred to Pharm.D. clinic for consideration of GLP-1.  Discussed with the patient at length and she is interested in a trial of this.     Medication Adjustments/Labs and Tests Ordered: Current medicines are reviewed at length with the patient today.  Concerns regarding medicines are outlined above.  Orders Placed This Encounter  Procedures   AMB Referral to [facility] Pharm-D   Meds ordered this encounter  Medications   fluconazole (DIFLUCAN) 150 MG tablet    Sig: Take 1 tablet (150 mg total) by mouth daily.    Dispense:  1 tablet    Refill:  0    Patient Instructions  Medication Instructions:  TAKE Fluconazole 150mg  once *If you need a refill on your cardiac medications before your next appointment, please call your pharmacy*   Lab Work: NONE If you have labs (blood work) drawn today and your tests are completely normal, you will receive your results only by: MyChart Message (if you have MyChart) OR A paper copy in the mail If you have any lab test that is abnormal or we need to change your treatment, we will call you to review the results.   Testing/Procedures: Ambulatory referral to PharmD (GLP-1)   Follow-Up: At [facility], you and your health needs are our priority.  As part of our continuing mission to provide you with exceptional heart care, we have created designated Provider Care Teams.  These Care Teams include your primary Cardiologist (physician) and Advanced Practice Providers (APPs -  Physician Assistants and Nurse Practitioners) who all work together to provide you with the care you need, when you need it.  We recommend signing  up for the patient portal called "MyChart".  Sign up information is provided on this After Visit Summary.  MyChart is used to connect with patients for Virtual Visits (Telemedicine).  Patients are able to view lab/test results, encounter notes, upcoming appointments, etc.  Non-urgent messages can be sent to your provider as well.   To learn more about what you can do with MyChart, go to [email or web address].    Your next appointment:   6 month(s)  Provider:  Tonny Bollman, MD        Signed, Tonny Bollman, MD  04/24/2023 12:52 PM    [facility]

## 2023-05-01 ENCOUNTER — Other Ambulatory Visit: Payer: No Typology Code available for payment source

## 2023-05-09 ENCOUNTER — Other Ambulatory Visit (HOSPITAL_COMMUNITY): Payer: Self-pay

## 2023-05-14 ENCOUNTER — Other Ambulatory Visit: Payer: Self-pay | Admitting: Cardiovascular Disease

## 2023-05-15 ENCOUNTER — Ambulatory Visit: Payer: No Typology Code available for payment source

## 2023-05-26 ENCOUNTER — Other Ambulatory Visit (HOSPITAL_COMMUNITY): Payer: Self-pay

## 2023-06-15 ENCOUNTER — Other Ambulatory Visit: Payer: Self-pay

## 2023-06-19 ENCOUNTER — Ambulatory Visit: Payer: No Typology Code available for payment source

## 2023-07-03 ENCOUNTER — Other Ambulatory Visit (HOSPITAL_COMMUNITY): Payer: Self-pay

## 2023-07-03 ENCOUNTER — Ambulatory Visit: Payer: Medicare Other | Attending: Cardiology | Admitting: Pharmacist

## 2023-07-03 VITALS — Wt 208.0 lb

## 2023-07-03 DIAGNOSIS — E118 Type 2 diabetes mellitus with unspecified complications: Secondary | ICD-10-CM | POA: Diagnosis present

## 2023-07-03 DIAGNOSIS — Z794 Long term (current) use of insulin: Secondary | ICD-10-CM | POA: Insufficient documentation

## 2023-07-03 MED ORDER — ZEPBOUND 5 MG/0.5ML ~~LOC~~ SOAJ
5.0000 mg | SUBCUTANEOUS | 0 refills | Status: DC
  Filled 2023-07-03: qty 2, 28d supply, fill #0

## 2023-07-03 MED ORDER — FUROSEMIDE 20 MG PO TABS
20.0000 mg | ORAL_TABLET | Freq: Every day | ORAL | 1 refills | Status: AC | PRN
  Filled 2023-07-03: qty 90, 90d supply, fill #0

## 2023-07-03 NOTE — Patient Instructions (Addendum)
Zepbound Counseling Points This medication reduces your appetite and may make you feel fuller longer.  Stop eating when your body tells you that you are full. This will likely happen sooner than you are used to. Fried/greasy food and sweets may upset your stomach - minimize these as much as possible. Store your medication in the fridge until you are ready to use it. Inject your medication in the fatty tissue of your lower abdominal area (2 inches away from belly button) or upper outer thigh. Rotate injection sites. Common side effects include: nausea, diarrhea/constipation, and heartburn, and are more likely to occur if you overeat. Stop your injection for 7 days prior to surgical procedures requiring anesthesia.  I'll call you monthly for dose titrations  Call me if you notice any lows with your glucose and we can cut back on your insulin Aundra Millet, PharmD [phone]  Tips for success: Write down the reasons why you want to lose weight and post it in a place where you'll see it often.  Start small and work your way up. Keep in mind that it takes time to achieve goals, and small steps add up.  Any additional movements help to burn calories. Taking the stairs rather than the elevator and parking at the far end of your parking lot are easy ways to start. Brisk walking for at least 30 minutes 4 or more days of the week is an excellent goal to work toward  Understanding what it means to feel full: Did you know that it can take 15 minutes or more for your brain to receive the message that you've eaten? That means that, if you eat less food, but consume it slower, you may still feel satisfied.  Eating a lot of fruits and vegetables can also help you feel fuller.  Eat off of smaller plates so that moderate portions don't seem too small  Tips for living a healthier life     Building a Healthy and Balanced Diet Make most of your meal vegetables and fruits -  of your plate. Aim for color and  variety, and remember that potatoes don't count as vegetables on the Healthy Eating Plate because of their negative impact on blood sugar.  Go for whole grains -  of your plate. Whole and intact grains--whole wheat, barley, wheat berries, quinoa, oats, brown rice, and foods made with them, such as whole wheat pasta--have a milder effect on blood sugar and insulin than white bread, white rice, and other refined grains.  Protein power -  of your plate. Fish, poultry, beans, and nuts are all healthy, versatile protein sources--they can be mixed into salads, and pair well with vegetables on a plate. Limit red meat, and avoid processed meats such as bacon and sausage.  Healthy plant oils - in moderation. Choose healthy vegetable oils like olive, canola, soy, corn, sunflower, peanut, and others, and avoid partially hydrogenated oils, which contain unhealthy trans fats. Remember that low-fat does not mean "healthy."  Drink water, coffee, or tea. Skip sugary drinks, limit milk and dairy products to one to two servings per day, and limit juice to a small glass per day.  Stay active. The red figure running across the Healthy Eating Plate's placemat is a reminder that staying active is also important in weight control.  The main message of the Healthy Eating Plate is to focus on diet quality:  The type of carbohydrate in the diet is more important than the amount of carbohydrate in the diet, because  some sources of carbohydrate--like vegetables (other than potatoes), fruits, whole grains, and beans--are healthier than others. The Healthy Eating Plate also advises consumers to avoid sugary beverages, a major source of calories--usually with little nutritional value--in the American diet. The Healthy Eating Plate encourages consumers to use healthy oils, and it does not set a maximum on the percentage of calories people should get each day from healthy sources of fat. In this way, the Healthy Eating Plate  recommends the opposite of the low-fat message promoted for decades by the USDA.  [employer]  SUGAR  Sugar is a huge problem in the modern day diet. Sugar is a big contributor to heart disease, diabetes, high triglyceride levels, fatty liver disease and obesity. Sugar is hidden in almost all packaged foods/beverages. Added sugar is extra sugar that is added beyond what is naturally found and has no nutritional benefit for your body. The American Heart Association recommends limiting added sugars to no more than 25g for women and 36 grams for men per day. There are many names for sugar including maltose, sucrose (names ending in "ose"), high fructose corn syrup, molasses, cane sugar, corn sweetener, raw sugar, syrup, honey or fruit juice concentrate.   One of the best ways to limit your added sugars is to stop drinking sweetened beverages such as soda, sweet tea, and fruit juice.  There is 65g of added sugars in one 20oz bottle of Coke! That is equal to 7.5 donuts.   Pay attention and read all nutrition facts labels. Below is an examples of a nutrition facts label. The #1 is showing you the total sugars where the # 2 is showing you the added sugars. This one serving has almost the max amount of added sugars per day!   EXERCISE  Exercise is good. We've all heard that. In an ideal world, we would all have time and resources to get plenty of it. When you are active, your heart pumps more efficiently and you will feel better.  Multiple studies show that even walking regularly has benefits that include living a longer life. The American Heart Association recommends 150 minutes per week of exercise (30 minutes per day most days of the week). You can do this in any increment you wish. Nine or more 10-minute walks count. So does an hour-long exercise class. Break the time apart into what will work in your life. Some of the best things you can do include  walking briskly, jogging, cycling or swimming laps. Not everyone is ready to "exercise." Sometimes we need to start with just getting active. Here are some easy ways to be more active throughout the day:  Take the stairs instead of the elevator  Go for a 10-15 minute walk during your lunch break (find a friend to make it more enjoyable)  When shopping, park at the back of the parking lot  If you take public transportation, get off one stop early and walk the extra distance  Pace around while making phone calls  Check with your doctor if you aren't sure what your limitations may be. Always remember to drink plenty of water when doing any type of exercise. Don't feel like a failure if you're not getting the 90-150 minutes per week. If you started by being a couch potato, then just a 10-minute walk each day is a huge improvement. Start with little victories and work your way up.   HEALTHY EATING TIPS  Plan ahead: make a menu of the meals for a week then create a grocery list to go with that menu. Consider meals that easily stretch into a night of leftovers, such as stews or casseroles. Or consider making two of your favorite meal and put one in the freezer for another night. Try a night or two each week that is "meatless" or "no cook" such as salads. When you get home from the grocery store wash and prepare your vegetables and fruits. Then when you need them they are ready to go.   Tips for going to the grocery store:  Buy store or generic brands  Check the weekly ad from your store on-line or in their in-store flyer  Look at the unit price on the shelf tag to compare/contrast the costs of different items  Buy fruits/vegetables in season  Carrots, bananas and apples are low-cost, naturally healthy items  If meats or frozen vegetables are on sale, buy some extras and put in your freezer  Limit buying prepared or "ready to eat" items, even if they are pre-made salads or fruit snacks  Do  not shop when you're hungry  Foods at eye level tend to be more expensive. Look on the high and low shelves for deals.  Consider shopping at the farmer's market for fresh foods in season.  Avoid the cookie and chip aisles (these are expensive, high in calories and low in nutritional value). Shop on the outside of the grocery store.  Healthy food preparations:  If you can't get lean hamburger, be sure to drain the fat when cooking  Steam, saut (in olive oil), grill or bake foods  Experiment with different seasonings to avoid adding salt to your foods. Kosher salt, sea salt and Himalayan salt are all still salt and should be avoided. Try seasoning food with onion, garlic, thyme, rosemary, basil ect. Onion powder or garlic powder is ok. Avoid if it says salt (ie garlic salt).

## 2023-07-03 NOTE — Progress Notes (Signed)
Patient ID: Traci Stout                 DOB: 08/10/1946                    MRN: [id number]     HPI: Traci Stout is a 77 y.o. female patient referred to pharmacy clinic by Dr Excell Seltzer to initiate GLP1-RA therapy. PMH is significant for carotid stenosis, 08/2016 cath with 30% stenosis of prox LAD and Ost 1st Diag, 40% stenosis of Ost 1st Mrg, HTN, HLD, T2DM, IBS, diastolic HF, GERD, OA, and hypothyroidism. Most recent BMI 39.  Patient has Nurse, learning disability. I submitted authorization for Zepbound which her insurance approved through 03/02/24. She does have a history of T2DM which was not listed in Epic, I have added this to her PMH. She is on Lantus, mealtime insulin, and Trulicity. She stopped her Jardiance - after she saw Dr Excell Seltzer and was treated for a yeast infection, she had 2 more. None since stopping the med. Has been on Trulicity for 7-8 years, injects on Thursdays. Reports A1c is around 7%, I can't see labs in Epic. Wears a Dexcom. Daughter works at [employer] and accompanies pt to visit today.  Diet: Working to cut back on soda  Exercise: Limited with knee pain  Family History: CAD in unknown relative. HF in her mother, father, and unknown relative.  Social History: Former tobacco use, quit in 2007.   Wt Readings from Last 1 Encounters:  04/24/23 208 lb (94.3 kg)    BP Readings from Last 1 Encounters:  04/24/23 124/80   Pulse Readings from Last 1 Encounters:  04/24/23 87    No results found for: "CHOL", "TRIG", "HDL", "CHOLHDL", "VLDL", "LDLCALC", "LDLDIRECT"  Past Medical History:  Diagnosis Date   Carotid artery disease (HCC)    a. Carotid US 9/17: R 60-79%; L 40-59% >> f/u 1 year // Carotid US 10/19: R 60-79; L 40-59, FU 1 year   Dyslipidemia    Dyspnea    GERD (gastroesophageal reflux disease)    HTN (hypertension)    Hypothyroidism    Osteoarthritis     Current Outpatient Medications on File Prior to Visit  Medication Sig Dispense Refill   albuterol (PROVENTIL  HFA;VENTOLIN HFA) 108 (90 Base) MCG/ACT inhaler Inhale 2 puffs into the lungs every 6 (six) hours as needed for wheezing or shortness of breath.     amLODipine (NORVASC) 5 MG tablet Take 5 mg by mouth daily.     aspirin 81 MG tablet Take 81 mg by mouth as needed.     Continuous Blood Gluc Receiver (DEXCOM G6 RECEIVER) DEVI Use as directed 1 each 3   Continuous Blood Gluc Receiver (DEXCOM G7 RECEIVER) DEVI Use every 10 days 9 each 3   Continuous Blood Gluc Receiver (DEXCOM G7 RECEIVER) DEVI Use as directed every 90 days 1 each 3   Continuous Blood Gluc Sensor (DEXCOM G6 SENSOR) MISC Use as directed and change sensor every 10 days. 3 each 11   Continuous Blood Gluc Transmit (DEXCOM G6 TRANSMITTER) MISC Use as directed and change every 90 days. 1 each 0   Continuous Glucose Sensor (DEXCOM G7 SENSOR) MISC Change sensor every 10 days 3 each 11   empagliflozin (JARDIANCE) 10 MG TABS tablet Take 1 tablet (10 mg total) by mouth daily before breakfast. 90 tablet 3   fluconazole (DIFLUCAN) 150 MG tablet Take 1 tablet (150 mg total) by mouth daily. 1 tablet 0   furosemide (LASIX)  20 MG tablet Take 1 tablet (20 mg total) by mouth daily as needed for weight gain greater than 3-5 pounds from baseline/usual weight 90 tablet 1   LANTUS SOLOSTAR 100 UNIT/ML Solostar Pen Inject 54 Units into the skin daily at 10 pm.     levothyroxine (SYNTHROID, LEVOTHROID) 100 MCG tablet Take 100 mcg by mouth daily before breakfast.     losartan (COZAAR) 100 MG tablet Take 100 mg by mouth daily.     metoprolol succinate (TOPROL-XL) 25 MG 24 hr tablet Take 25 mg by mouth daily.     nitroGLYCERIN (NITROSTAT) 0.4 MG SL tablet as needed.     NOVOLOG FLEXPEN 100 UNIT/ML FlexPen Inject 2-4 Units into the skin 3 (three) times daily with meals. Per sliding scale     omeprazole (PRILOSEC) 20 MG capsule Take 20 mg by mouth daily.       rosuvastatin (CRESTOR) 40 MG tablet Take 0.5 tablets (20 mg total) by mouth daily. 90 tablet 3    TRULICITY 1.5 MG/0.5ML SOPN Inject 1.5 mg as directed once a week.      No current facility-administered medications on file prior to visit.    Allergies  Allergen Reactions   Codeine Nausea Only     Assessment/Plan:  1. Weight loss/T2DM - Patient has not met goal of at least 5% of body weight loss with comprehensive lifestyle modifications alone in the past 3-6 months. Pharmacotherapy is appropriate to pursue as augmentation. Will start Zepbound 5mg  SQ weekly - transitioning over from Trulicity 1.5mg  weekly. Injection technique reviewed at today's visit, same pen device as Trulicity.  Advised patient on common side effects including nausea, diarrhea, dyspepsia, decreased appetite, and fatigue. Counseled patient on reducing meal size and how to titrate medication to minimize side effects. Counseled patient to call if intolerable side effects or if experiencing dehydration, abdominal pain, or dizziness. Patient will adhere to dietary modifications and will target at least 150 minutes of moderate intensity exercise weekly.   Will call patient monthly for tolerability updates and dose titrations. She is on Lantus 54u daily and mealtime insulin as well. Anticipate we will be able to decrease insulin as her Zepbound is titrated up. Also advised pt she can look for copay card online if needed.  Birt Reinoso E. Kimiko Common, PharmD, BCACP, CPP [facility] [address], [place], [place] [address] Phone: [phone]; Fax: [phone] 07/03/2023 12:22 PM

## 2023-07-04 ENCOUNTER — Other Ambulatory Visit: Payer: Self-pay

## 2023-07-04 ENCOUNTER — Encounter: Payer: Self-pay | Admitting: Pharmacist

## 2023-07-04 ENCOUNTER — Other Ambulatory Visit (HOSPITAL_COMMUNITY): Payer: Self-pay

## 2023-07-05 ENCOUNTER — Other Ambulatory Visit: Payer: Self-pay

## 2023-07-05 ENCOUNTER — Other Ambulatory Visit (HOSPITAL_COMMUNITY): Payer: Self-pay

## 2023-07-06 ENCOUNTER — Other Ambulatory Visit: Payer: Self-pay

## 2023-07-07 ENCOUNTER — Other Ambulatory Visit: Payer: Self-pay

## 2023-07-11 ENCOUNTER — Other Ambulatory Visit: Payer: Self-pay

## 2023-07-24 ENCOUNTER — Other Ambulatory Visit (HOSPITAL_COMMUNITY): Payer: Self-pay

## 2023-07-24 ENCOUNTER — Telehealth: Payer: Self-pay | Admitting: Pharmacist

## 2023-07-24 ENCOUNTER — Other Ambulatory Visit: Payer: Self-pay

## 2023-07-24 MED ORDER — ZEPBOUND 7.5 MG/0.5ML ~~LOC~~ SOAJ
7.5000 mg | SUBCUTANEOUS | 0 refills | Status: DC
  Filled 2023-07-24: qty 2, 28d supply, fill #0

## 2023-07-24 NOTE — Telephone Encounter (Signed)
Called pt to follow up with Zepbound. Tolerating well, hasn't lost any weight yet, last week noticed a bit of a decrease in her appetite. No low glucose readings. Will increase dose to 7.5mg  weekly for the next month.

## 2023-08-15 ENCOUNTER — Telehealth: Payer: Self-pay | Admitting: Pharmacist

## 2023-08-15 ENCOUNTER — Other Ambulatory Visit (HOSPITAL_COMMUNITY): Payer: Self-pay

## 2023-08-15 NOTE — Telephone Encounter (Signed)
Called pt to follow up with Zepbound tolerability, left message. Will titrate dose if able. Will need update on home glucose readings as well.

## 2023-08-16 NOTE — Telephone Encounter (Signed)
2nd message left for pt.

## 2023-08-16 NOTE — Telephone Encounter (Signed)
Patient is returning call and is requesting return call.

## 2023-08-18 ENCOUNTER — Other Ambulatory Visit: Payer: Self-pay

## 2023-08-18 MED ORDER — ZEPBOUND 7.5 MG/0.5ML ~~LOC~~ SOAJ
7.5000 mg | SUBCUTANEOUS | 0 refills | Status: DC
  Filled 2023-08-18: qty 2, 28d supply, fill #0

## 2023-08-18 NOTE — Telephone Encounter (Signed)
Called pt, had 3-4 days of diarrhea after first dose of 7.5mg  dose but resolved with subsequent injections. Reports improvement in her glucose - mostly 140s, no lows < 70. Appetite is down, thinks she's only lost a pound. Prefers to stay on 7.5mg  dose for an extra month, then increase to 10mg  next month. Refill sent in.

## 2023-08-21 ENCOUNTER — Other Ambulatory Visit (HOSPITAL_COMMUNITY): Payer: Self-pay

## 2023-08-21 ENCOUNTER — Other Ambulatory Visit: Payer: Self-pay

## 2023-08-21 ENCOUNTER — Encounter: Payer: Self-pay | Admitting: Pharmacist

## 2023-08-21 MED ORDER — ZEPBOUND 7.5 MG/0.5ML ~~LOC~~ SOAJ: 7.5000 mg | SUBCUTANEOUS | 0 refills | Status: DC

## 2023-08-22 ENCOUNTER — Other Ambulatory Visit: Payer: Self-pay

## 2023-09-12 ENCOUNTER — Other Ambulatory Visit: Payer: Self-pay

## 2023-09-12 ENCOUNTER — Encounter: Payer: Self-pay | Admitting: Cardiovascular Disease

## 2023-09-15 MED ORDER — ZEPBOUND 10 MG/0.5ML ~~LOC~~ SOAJ: 10.0000 mg | SUBCUTANEOUS | 0 refills | Status: DC

## 2023-09-15 NOTE — Addendum Note (Signed)
Addended by: Odelle Kosier E on: 09/15/2023 10:17 AM   Modules accepted: Orders

## 2023-09-15 NOTE — Telephone Encounter (Signed)
Updated rx sent in for 10mg  dose for next month as previously discussed with pt.

## 2023-10-10 ENCOUNTER — Other Ambulatory Visit: Payer: Self-pay

## 2023-10-13 ENCOUNTER — Telehealth: Payer: Self-pay | Admitting: Pharmacist

## 2023-10-13 MED ORDER — ZEPBOUND 10 MG/0.5ML ~~LOC~~ SOAJ: 10.0000 mg | SUBCUTANEOUS | 0 refills | Status: DC

## 2023-10-13 NOTE — Telephone Encounter (Signed)
Called pt to follow up with Zepbound. She is still on the 7.5mg  dose, reports pharmacy never filled the 10mg  dose I sent in a month ago? Tolerating med well, occasional low glucose readings, cut back her insulin to 50u a week after she started on Zepbound. Will resend in rx for 10mg  dose for next month, discussed that we'll likely need to continue cutting back on her insulin. She will let me know if she has any consistent lows in the next month, otherwise I'll follow up at that time.

## 2023-10-16 ENCOUNTER — Encounter: Payer: Self-pay | Admitting: Cardiovascular Disease

## 2023-10-16 ENCOUNTER — Ambulatory Visit: Payer: Medicare Other | Attending: Cardiovascular Disease | Admitting: Cardiovascular Disease

## 2023-10-16 VITALS — BP 132/54 | HR 74 | Ht 61.0 in | Wt 204.4 lb

## 2023-10-16 DIAGNOSIS — I350 Nonrheumatic aortic (valve) stenosis: Secondary | ICD-10-CM | POA: Diagnosis present

## 2023-10-16 DIAGNOSIS — E782 Mixed hyperlipidemia: Secondary | ICD-10-CM | POA: Insufficient documentation

## 2023-10-16 DIAGNOSIS — I5032 Chronic diastolic (congestive) heart failure: Secondary | ICD-10-CM | POA: Diagnosis not present

## 2023-10-16 DIAGNOSIS — I6523 Occlusion and stenosis of bilateral carotid arteries: Secondary | ICD-10-CM | POA: Diagnosis not present

## 2023-10-16 DIAGNOSIS — I10 Essential (primary) hypertension: Secondary | ICD-10-CM | POA: Insufficient documentation

## 2023-10-16 NOTE — Progress Notes (Signed)
Cardiology Office Note:    Date:  10/16/2023   ID:  Traci Stout, DOB 02-19-1946, MRN [id number]  PCP:  Aviva Kluver   [facility] Providers Cardiologist:  Tonny Bollman, MD     Referring MD: No ref. provider found   Chief Complaint  Patient presents with   Follow-up    CHF    History of Present Illness:    Traci Stout is a 77 y.o. female presenting for follow-up evaluation.  She has been followed for carotid stenosis with no history of stroke, hypertension, mixed hyperlipidemia, and heart failure with preserved ejection fraction.  The patient is here with her daughter and son today.  She has been doing well.  She was not able to tolerate Jardiance due to recurrent yeast infections.  She has started on Zepbound for weight loss and is feeling quite well with this.  States that her diabetes is better controlled.  Has not appreciated much weight loss yet but is she is happy with her early results.  She denies chest pain, chest pressure, shortness of breath, or leg swelling.   Current Medications: Current Meds  Medication Sig   albuterol (PROVENTIL HFA;VENTOLIN HFA) 108 (90 Base) MCG/ACT inhaler Inhale 2 puffs into the lungs every 6 (six) hours as needed for wheezing or shortness of breath.   amLODipine (NORVASC) 10 MG tablet Take 10 mg by mouth daily.   aspirin 81 MG tablet Take 81 mg by mouth as needed.   Continuous Blood Gluc Receiver (DEXCOM G7 RECEIVER) DEVI Use every 10 days   Continuous Glucose Sensor (DEXCOM G7 SENSOR) MISC Change sensor every 10 days   furosemide (LASIX) 20 MG tablet Take 1 tablet (20 mg total) by mouth daily as needed for weight gain greater than 3-5 pounds from baseline/usual weight   LANTUS SOLOSTAR 100 UNIT/ML Solostar Pen Inject 54 Units into the skin daily at 10 pm.   losartan (COZAAR) 100 MG tablet Take 100 mg by mouth daily.   metoprolol succinate (TOPROL-XL) 25 MG 24 hr tablet Take 25 mg by mouth daily.   naproxen (NAPROSYN) 500 MG  tablet Take by mouth.   nitroGLYCERIN (NITROSTAT) 0.4 MG SL tablet as needed.   NOVOLOG FLEXPEN 100 UNIT/ML FlexPen Inject 2-4 Units into the skin 3 (three) times daily with meals. Per sliding scale   omeprazole (PRILOSEC) 20 MG capsule Take 20 mg by mouth daily.     rosuvastatin (CRESTOR) 40 MG tablet Take 0.5 tablets (20 mg total) by mouth daily.   SYNTHROID 112 MCG tablet Take by mouth.   tirzepatide (ZEPBOUND) 10 MG/0.5ML Pen Inject 10 mg into the skin once a week.     Allergies:   Jardiance [empagliflozin] and Codeine   ROS:   Please see the history of present illness.    All other systems reviewed and are negative.  EKGs/Labs/Other Studies Reviewed:    The following studies were reviewed today: Cardiac Studies & Procedures   CARDIAC CATHETERIZATION  CARDIAC CATHETERIZATION 09/21/2016  Narrative  Ost 1st Mrg to 1st Mrg lesion, 40 %stenosed.  Prox LAD lesion, 30 %stenosed.  Ost 1st Diag to 1st Diag lesion, 30 %stenosed.  1. 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 Normal/hyperdynamic LV systolic function with LVEF 70-75% and normal LVEDP  Suspect noncardiac symptoms. Continue risk reduction/medical therapy for nonobstructive CAD  Findings Coronary Findings Diagnostic  Dominance: Right  Left Main Vessel is angiographically normal.  Left Anterior Descending  First Diagonal Branch  Left Circumflex Vessel is angiographically normal.  First Obtuse Marginal Branch ostial lesion, nonobstructive  Second Obtuse Marginal Branch Vessel is angiographically normal.  Third Obtuse Marginal Branch Vessel is angiographically normal.  Right Coronary Artery Large, dominant vessel with diffuse calcification but no stenosis or irregularity visualized.  Intervention  No interventions have been documented.     ECHOCARDIOGRAM  ECHOCARDIOGRAM COMPLETE 10/27/2022  Narrative ECHOCARDIOGRAM REPORT    Patient Name:    [facility] Date of Exam: 10/27/2022 Medical Rec #:  [id number]      Height:       61.0 in Accession #:    [id number]     Weight:       219.0 lb Date of Birth:  26-Dec-1946      BSA:          1.963 m Patient Age:    35 years       BP:           146/72 mmHg Patient Gender: F              HR:           87 bpm. Exam Location:  Outpatient  Procedure: 2D Echo, Cardiac Doppler, Color Doppler and Strain Analysis  Indications:    Bilateral carotid artery stenosis [I65.23 (ICD-10-CM)]; Primary hypertension [I10 (ICD-10-CM)]; Mixed hyperlipidemia [E78.2 (ICD-10-CM)]; Heart failure due to valvular disease, unspecified heart failure type (HCC) [I50.9, I38 (ICD-10-CM)]  History:        Patient has no prior history of Echocardiogram examinations. Carotid Disease; Risk Factors:Hypertension.  Sonographer:    Leta Jungling RDCS Referring Phys: [phone] Raymone Pembroke   Sonographer Comments: Global longitudinal strain was attempted. IMPRESSIONS   1. Left ventricular ejection fraction, by estimation, is 65 to 70%. The left ventricle has normal function. The left ventricle has no regional wall motion abnormalities. There is mild left ventricular hypertrophy. Left ventricular diastolic parameters are indeterminate. Elevated left ventricular end-diastolic pressure. 2. Right ventricular systolic function is normal. The right ventricular size is normal. Tricuspid regurgitation signal is inadequate for assessing PA pressure. 3. The mitral valve was not well visualized. Trivial mitral valve regurgitation. Severe mitral annular calcification. 4. The aortic valve was not well visualized. There is moderate calcification of the aortic valve. There is mild thickening of the aortic valve. Aortic valve regurgitation is not visualized. Mild aortic valve stenosis. Aortic valve mean gradient measures 10.0 mmHg. 5. The inferior vena cava is normal in size with greater than 50% respiratory variability, suggesting right  atrial pressure of 3 mmHg.  Comparison(s): No prior Echocardiogram.  Conclusion(s)/Recommendation(s): Technically limited images. Aortic sclerosis with mild aortic stenosis. Normal LVEF; while diastology is technically indeterminate, LV E/e' is elevated, suggesting elevated filling pressure. RA pressure is normal.  FINDINGS Left Ventricle: Left ventricular ejection fraction, by estimation, is 65 to 70%. The left ventricle has normal function. The left ventricle has no regional wall motion abnormalities. Global longitudinal strain performed but not reported based on interpreter judgement due to suboptimal tracking. The left ventricular internal cavity size was normal in size. There is mild left ventricular hypertrophy. Left ventricular diastolic parameters are indeterminate. Elevated left ventricular end-diastolic pressure. The E/e' is 19.5.  Right Ventricle: The right ventricular size is normal. Right vetricular wall thickness was not well visualized. Right ventricular systolic function is normal. Tricuspid regurgitation signal is inadequate for assessing PA pressure.  Left Atrium: Left atrial size was normal in size.  Right Atrium: Right atrial size was normal in size.  Pericardium: There is no evidence of pericardial effusion.  Mitral Valve: The mitral valve was not well visualized. Severe mitral annular calcification. Trivial mitral valve regurgitation. MV peak gradient, 7.3 mmHg. The mean mitral valve gradient is 3.3 mmHg.  Tricuspid Valve: The tricuspid valve is not well visualized. Tricuspid valve regurgitation is trivial. No evidence of tricuspid stenosis.  Aortic Valve: The aortic valve was not well visualized. There is moderate calcification of the aortic valve. There is mild thickening of the aortic valve. Aortic valve regurgitation is not visualized. Mild aortic stenosis is present. Aortic valve mean gradient measures 10.0 mmHg. Aortic valve peak gradient measures 17.7 mmHg. Aortic  valve area, by VTI measures 2.21 cm.  Pulmonic Valve: The pulmonic valve was not well visualized. Pulmonic valve regurgitation is not visualized.  Aorta: The aortic root was not well visualized, the ascending aorta was not well visualized and the aortic arch was not well visualized.  Venous: The inferior vena cava is normal in size with greater than 50% respiratory variability, suggesting right atrial pressure of 3 mmHg.  IAS/Shunts: The interatrial septum was not well visualized.   LEFT VENTRICLE PLAX 2D LVIDd:         4.20 cm   Diastology LVIDs:         3.50 cm   LV e' medial:    4.62 cm/s LV PW:         1.00 cm   LV E/e' medial:  20.1 LV IVS:        1.10 cm   LV e' lateral:   4.87 cm/s LVOT diam:     1.80 cm   LV E/e' lateral: 19.0 LV SV:         73 LV SV Index:   37 LVOT Area:     2.54 cm   RIGHT VENTRICLE RV Basal diam:  2.40 cm RV Mid diam:    1.80 cm RV S prime:     12.20 cm/s TAPSE (M-mode): 1.6 cm  LEFT ATRIUM             Index        RIGHT ATRIUM          Index LA diam:        3.80 cm 1.94 cm/m   RA Area:     6.88 cm LA Vol (A2C):   41.3 ml 21.04 ml/m  RA Volume:   13.30 ml 6.78 ml/m LA Vol (A4C):   38.3 ml 19.51 ml/m LA Biplane Vol: 40.7 ml 20.73 ml/m AORTIC VALVE AV Area (Vmax):    2.03 cm AV Area (Vmean):   2.04 cm AV Area (VTI):     2.21 cm AV Vmax:           210.33 cm/s AV Vmean:          146.000 cm/s AV VTI:            0.329 m AV Peak Grad:      17.7 mmHg AV Mean Grad:      10.0 mmHg LVOT Vmax:         168.00 cm/s LVOT Vmean:        117.000 cm/s LVOT VTI:          0.285 m LVOT/AV VTI ratio: 0.87  AORTA Ao Root diam: 2.60 cm  MITRAL VALVE MV Area (PHT): 3.21 cm     SHUNTS MV Area VTI:   2.42 cm     Systemic VTI:  0.29 m MV Peak grad:  7.3 mmHg     Systemic Diam: 1.80 cm MV Mean grad:  3.3 mmHg MV Vmax:       1.35 m/s MV Vmean:      82.6 cm/s MV Decel Time: 236 msec MV E velocity: 92.70 cm/s MV A velocity: 139.00 cm/s MV E/A  ratio:  0.67  Jodelle Red MD Electronically signed by Jodelle Red MD Signature Date/Time: 10/27/2022/5:47:51 PM    Final             EKG:        Recent Labs: 10/26/2022: Hemoglobin 13.6; NT-Pro BNP 173; Platelets 251 12/20/2022: ALT 28; BUN 14; Creatinine, Ser 0.91; Potassium 4.0; Sodium 141  Recent Lipid Panel No results found for: "CHOL", "TRIG", "HDL", "CHOLHDL", "VLDL", "LDLCALC", "LDLDIRECT"   Risk Assessment/Calculations:                Physical Exam:    VS:  BP (!) 132/54   Pulse 74   Ht 5\' 1"  (1.549 m)   Wt 204 lb 6.4 oz (92.7 kg)   SpO2 96%   BMI 38.62 kg/m     Wt Readings from Last 3 Encounters:  10/16/23 204 lb 6.4 oz (92.7 kg)  07/03/23 208 lb (94.3 kg)  04/24/23 208 lb (94.3 kg)     GEN:  Well nourished, well developed in no acute distress HEENT: Normal NECK: No JVD; right greater than left carotid bruit LYMPHATICS: No lymphadenopathy CARDIAC: RRR, 2/6 systolic murmur at the right upper sternal border RESPIRATORY:  Clear to auscultation without rales, wheezing or rhonchi  ABDOMEN: Soft, non-tender, non-distended MUSCULOSKELETAL:  No edema; No deformity  SKIN: Warm and dry NEUROLOGIC:  Alert and oriented x 3 PSYCHIATRIC:  Normal affect   Assessment & Plan Bilateral carotid artery stenosis The patient is clinically stable, remaining asymptomatic.  She continues on aspirin and high intensity statin drug.  I reviewed her last carotid duplex 1 year ago which showed 40 to 59% ICA stenosis.  Will repeat a carotid duplex prior to her visit next year. Chronic diastolic heart failure (HCC) Appears to be doing well at present.  Continue current medical therapy.  Tolerating furosemide as needed.  Was unable to tolerate Jardiance.  Continue with weight loss efforts and lifestyle modification.  Discussed low-sodium diet today. Mixed hyperlipidemia Treated with a high intensity statin drug.  Lipids are followed by her primary physician.   Goal LDL cholesterol is less than 70. Primary hypertension Blood pressure well-controlled.  Continue amlodipine, losartan, and metoprolol succinate. Mild aortic stenosis Mean transvalvular gradient of 10 mmHg at the time of her last echo exam 1 year ago.  Repeat echocardiogram next year prior to her office visit.  Discussed the expected slow progression of aortic stenosis.  Will continue with surveillance echo and clinical visits.            Medication Adjustments/Labs and Tests Ordered: Current medicines are reviewed at length with the patient today.  Concerns regarding medicines are outlined above.  Orders Placed This Encounter  Procedures   ECHOCARDIOGRAM COMPLETE   VAS US CAROTID   No orders of the defined types were placed in this encounter.   Patient Instructions  Testing/Procedures: Carotid Ultrasound (to be done prior to next year's appt) Your physician has requested that you have a carotid duplex. This test is an ultrasound of the carotid arteries in your neck. It looks at blood flow through these arteries that supply the brain with blood. Allow one hour for this exam. There are  no restrictions or special instructions.  ECHO (to be done prior to next year's appt) Your physician has requested that you have an echocardiogram. Echocardiography is a painless test that uses sound waves to create images of your heart. It provides your doctor with information about the size and shape of your heart and how well your heart's chambers and valves are working. This procedure takes approximately one hour. There are no restrictions for this procedure. Please do NOT wear cologne, perfume, aftershave, or lotions (deodorant is allowed). Please arrive 15 minutes prior to your appointment time.  Follow-Up: At [facility], you and your health needs are our priority.  As part of our continuing mission to provide you with exceptional heart care, we have created designated Provider Care  Teams.  These Care Teams include your primary Cardiologist (physician) and Advanced Practice Providers (APPs -  Physician Assistants and Nurse Practitioners) who all work together to provide you with the care you need, when you need it.  Your next appointment:   1 year(s)  Provider:   Tonny Bollman, MD        Signed, Tonny Bollman, MD  10/16/2023 1:05 PM    [facility]

## 2023-10-16 NOTE — Patient Instructions (Signed)
Testing/Procedures: Carotid Ultrasound (to be done prior to next year's appt) Your physician has requested that you have a carotid duplex. This test is an ultrasound of the carotid arteries in your neck. It looks at blood flow through these arteries that supply the brain with blood. Allow one hour for this exam. There are no restrictions or special instructions.  ECHO (to be done prior to next year's appt) Your physician has requested that you have an echocardiogram. Echocardiography is a painless test that uses sound waves to create images of your heart. It provides your doctor with information about the size and shape of your heart and how well your heart's chambers and valves are working. This procedure takes approximately one hour. There are no restrictions for this procedure. Please do NOT wear cologne, perfume, aftershave, or lotions (deodorant is allowed). Please arrive 15 minutes prior to your appointment time.  Follow-Up: At [facility], you and your health needs are our priority.  As part of our continuing mission to provide you with exceptional heart care, we have created designated Provider Care Teams.  These Care Teams include your primary Cardiologist (physician) and Advanced Practice Providers (APPs -  Physician Assistants and Nurse Practitioners) who all work together to provide you with the care you need, when you need it.  Your next appointment:   1 year(s)  Provider:   Tonny Bollman, MD

## 2023-11-09 ENCOUNTER — Other Ambulatory Visit: Payer: Self-pay

## 2023-11-10 MED ORDER — ZEPBOUND 10 MG/0.5ML ~~LOC~~ SOAJ: 10.0000 mg | SUBCUTANEOUS | 0 refills | Status: DC

## 2023-11-10 NOTE — Telephone Encounter (Signed)
Called pt to follow up on tolerability of zepbound. She is doing well. She did have two episodes where her BG was 65. She is on 50 units of lantus. I have asked her to decrease to 46 units. She is not taking novolog.  She would like to stay on zepbound 10mg  for a little longer. Follow up next month.

## 2023-11-10 NOTE — Addendum Note (Signed)
Addended by: Malena Peer D on: 11/10/2023 12:57 PM   Modules accepted: Orders

## 2023-12-08 ENCOUNTER — Telehealth: Payer: Self-pay | Admitting: Pharmacist

## 2023-12-08 NOTE — Telephone Encounter (Signed)
Called pt to follow up on Mounjaro tolerability and her blood sugars. Last month I asked her to decrease her lantus to 46 units. LVM for her to call back

## 2023-12-09 ENCOUNTER — Other Ambulatory Visit: Payer: Self-pay | Admitting: Cardiovascular Disease

## 2023-12-11 MED ORDER — ZEPBOUND 10 MG/0.5ML ~~LOC~~ SOAJ: 10.0000 mg | SUBCUTANEOUS | 3 refills | Status: DC

## 2023-12-11 NOTE — Telephone Encounter (Signed)
Per pt A1C is 6. Fasting BG ~ 110. Has had a few post meal lows.  Wants to stay at 10mg  of Zepboud. Rx sent.

## 2023-12-16 ENCOUNTER — Other Ambulatory Visit (HOSPITAL_COMMUNITY): Payer: Self-pay

## 2023-12-18 ENCOUNTER — Other Ambulatory Visit (HOSPITAL_COMMUNITY): Payer: Self-pay

## 2024-01-12 ENCOUNTER — Other Ambulatory Visit: Payer: Self-pay

## 2024-02-08 ENCOUNTER — Encounter: Payer: Self-pay | Admitting: Cardiovascular Disease

## 2024-02-08 MED ORDER — ZEPBOUND 10 MG/0.5ML ~~LOC~~ SOAJ: 10.0000 mg | SUBCUTANEOUS | 11 refills | Status: DC

## 2024-02-09 ENCOUNTER — Other Ambulatory Visit (HOSPITAL_COMMUNITY): Payer: Self-pay

## 2024-02-09 ENCOUNTER — Other Ambulatory Visit: Payer: Self-pay

## 2024-03-06 ENCOUNTER — Other Ambulatory Visit (HOSPITAL_COMMUNITY): Payer: Self-pay

## 2024-03-08 ENCOUNTER — Other Ambulatory Visit (HOSPITAL_COMMUNITY): Payer: Self-pay

## 2024-03-08 ENCOUNTER — Telehealth: Payer: Self-pay | Admitting: Pharmacy Technician

## 2024-03-08 NOTE — Telephone Encounter (Signed)
 Pharmacy Patient Advocate Encounter  Received notification from [facility] that Prior Authorization for zepbound has been APPROVED from 03/08/24 to 03/08/25. Spoke to pharmacy to process.Copay is $15.00.    PA #/Case ID/Reference #: [id number]

## 2024-03-08 NOTE — Telephone Encounter (Signed)
 Pharmacy Patient Advocate Encounter   Received notification from CoverMyMeds that prior authorization for ZEPBOUND is required/requested.   Insurance verification completed.   The patient is insured through [facility] .   Per test claim: PA required; PA submitted to above mentioned insurance via CoverMyMeds Key/confirmation #/EOC [id number] Status is pending

## 2024-03-15 ENCOUNTER — Other Ambulatory Visit (HOSPITAL_COMMUNITY): Payer: Self-pay

## 2024-04-09 ENCOUNTER — Encounter: Payer: Self-pay | Admitting: Cardiovascular Disease

## 2024-04-09 ENCOUNTER — Telehealth: Payer: Self-pay | Admitting: Cardiovascular Disease

## 2024-04-09 MED ORDER — ZEPBOUND 10 MG/0.5ML ~~LOC~~ SOAJ: 10.0000 mg | SUBCUTANEOUS | 11 refills | Status: AC

## 2024-04-09 NOTE — Telephone Encounter (Signed)
 Spoke with pt's daughter (per DPR). She was notified that our pharmacy team was contacted regarding her mother's medication. A message had been sent on MyChart. Pt's daughter verbalized understanding. All questions if any were answered.

## 2024-04-09 NOTE — Telephone Encounter (Signed)
 Pt c/o medication issue:  1. Name of Medication: tirzepatide (ZEPBOUND) 10 MG/0.5ML Pen   2. How are you currently taking this medication (dosage and times per day)?  Inject 10 mg into the skin once a week.     3. Are you having a reaction (difficulty breathing--STAT)? No  4. What is your medication issue? Pt's daughter Egbert Grass is requesting a callback regarding her mom getting her medication and the MyChart message they sent this morning. "I am unable to request a Zepbound refill through Mychart and I have not heard from the Pharmacist. My next shot is due this Thursday 04/11/24." Please advise

## 2024-04-12 ENCOUNTER — Other Ambulatory Visit (HOSPITAL_COMMUNITY): Payer: Self-pay

## 2024-04-15 ENCOUNTER — Encounter (HOSPITAL_COMMUNITY): Payer: Self-pay

## 2024-04-15 ENCOUNTER — Other Ambulatory Visit (HOSPITAL_COMMUNITY): Payer: Self-pay

## 2024-04-17 ENCOUNTER — Other Ambulatory Visit (HOSPITAL_COMMUNITY): Payer: Self-pay

## 2024-04-18 ENCOUNTER — Encounter (HOSPITAL_COMMUNITY): Payer: Self-pay

## 2024-04-18 ENCOUNTER — Other Ambulatory Visit (HOSPITAL_COMMUNITY): Payer: Self-pay

## 2024-10-15 ENCOUNTER — Ambulatory Visit (HOSPITAL_BASED_OUTPATIENT_CLINIC_OR_DEPARTMENT_OTHER)
Admission: RE | Admit: 2024-10-15 | Discharge: 2024-10-15 | Disposition: A | Discharge status: Home / Self Care | Source: Ambulatory Visit | Attending: Cardiovascular Disease | Admitting: Cardiovascular Disease

## 2024-10-15 ENCOUNTER — Ambulatory Visit: Payer: Self-pay | Admitting: Physician Assistant

## 2024-10-15 ENCOUNTER — Ambulatory Visit (HOSPITAL_COMMUNITY)
Admission: RE | Admit: 2024-10-15 | Discharge: 2024-10-15 | Disposition: A | Discharge status: Home / Self Care | Source: Ambulatory Visit | Attending: Cardiovascular Disease | Admitting: Cardiovascular Disease

## 2024-10-15 DIAGNOSIS — R0609 Other forms of dyspnea: Secondary | ICD-10-CM | POA: Diagnosis not present

## 2024-10-15 DIAGNOSIS — I6523 Occlusion and stenosis of bilateral carotid arteries: Secondary | ICD-10-CM

## 2024-10-15 DIAGNOSIS — R600 Localized edema: Secondary | ICD-10-CM

## 2024-10-15 DIAGNOSIS — I5032 Chronic diastolic (congestive) heart failure: Secondary | ICD-10-CM | POA: Diagnosis present

## 2024-10-15 DIAGNOSIS — I10 Essential (primary) hypertension: Secondary | ICD-10-CM

## 2024-10-15 LAB — ECHOCARDIOGRAM COMPLETE
AR max vel: 1.64 cm2
AV Area VTI: 1.78 cm2
AV Area mean vel: 1.65 cm2
AV Mean grad: 7.7 mmHg
AV Peak grad: 14.1 mmHg
Ao pk vel: 1.88 m/s
Area-P 1/2: 2.03 cm2
MV VTI: 1.5 cm2
S' Lateral: 1.82 cm

## 2024-12-13 ENCOUNTER — Ambulatory Visit: Admitting: Cardiovascular Disease

## 2025-02-28 ENCOUNTER — Ambulatory Visit: Admitting: Cardiovascular Disease
